# Patient Record
Sex: Female | Born: 1940 | Race: White | Hispanic: No | Marital: Married | Smoking: Former smoker
Health system: Southern US, Community
[De-identification: ages and names within clinical notes are randomized; demographics above are authoritative.]

## PROBLEM LIST (undated history)

## (undated) DIAGNOSIS — M810 Age-related osteoporosis without current pathological fracture: Secondary | ICD-10-CM

## (undated) DIAGNOSIS — K219 Gastro-esophageal reflux disease without esophagitis: Secondary | ICD-10-CM

## (undated) DIAGNOSIS — B3731 Acute candidiasis of vulva and vagina: Secondary | ICD-10-CM

## (undated) DIAGNOSIS — L299 Pruritus, unspecified: Secondary | ICD-10-CM

## (undated) DIAGNOSIS — R7309 Other abnormal glucose: Secondary | ICD-10-CM

## (undated) DIAGNOSIS — J449 Chronic obstructive pulmonary disease, unspecified: Secondary | ICD-10-CM

## (undated) DIAGNOSIS — I10 Essential (primary) hypertension: Secondary | ICD-10-CM

## (undated) DIAGNOSIS — E785 Hyperlipidemia, unspecified: Secondary | ICD-10-CM

## (undated) DIAGNOSIS — H169 Unspecified keratitis: Secondary | ICD-10-CM

## (undated) DIAGNOSIS — B373 Candidiasis of vulva and vagina: Secondary | ICD-10-CM

## (undated) DIAGNOSIS — E119 Type 2 diabetes mellitus without complications: Secondary | ICD-10-CM

## (undated) DIAGNOSIS — E559 Vitamin D deficiency, unspecified: Secondary | ICD-10-CM

## (undated) DIAGNOSIS — J441 Chronic obstructive pulmonary disease with (acute) exacerbation: Secondary | ICD-10-CM

## (undated) DIAGNOSIS — L02619 Cutaneous abscess of unspecified foot: Secondary | ICD-10-CM

## (undated) DIAGNOSIS — E876 Hypokalemia: Secondary | ICD-10-CM

## (undated) DIAGNOSIS — R0989 Other specified symptoms and signs involving the circulatory and respiratory systems: Secondary | ICD-10-CM

## (undated) DIAGNOSIS — B49 Unspecified mycosis: Secondary | ICD-10-CM

## (undated) DIAGNOSIS — R799 Abnormal finding of blood chemistry, unspecified: Secondary | ICD-10-CM

## (undated) DIAGNOSIS — T148XXA Other injury of unspecified body region, initial encounter: Secondary | ICD-10-CM

## (undated) DIAGNOSIS — L03119 Cellulitis of unspecified part of limb: Secondary | ICD-10-CM

## (undated) HISTORY — DX: Type 2 diabetes mellitus without complications: E11.9

## (undated) HISTORY — DX: Unspecified mycosis: B49

## (undated) HISTORY — PX: URETHROTOMY: SHX1083

## (undated) HISTORY — DX: Other specified symptoms and signs involving the circulatory and respiratory systems: R09.89

## (undated) HISTORY — DX: Age-related osteoporosis without current pathological fracture: M81.0

## (undated) HISTORY — DX: Other abnormal glucose: R73.09

## (undated) HISTORY — DX: Abnormal finding of blood chemistry, unspecified: R79.9

## (undated) HISTORY — DX: Acute candidiasis of vulva and vagina: B37.31

## (undated) HISTORY — DX: Essential (primary) hypertension: I10

## (undated) HISTORY — PX: APPENDECTOMY: SHX54

## (undated) HISTORY — DX: Cutaneous abscess of unspecified foot: L02.619

## (undated) HISTORY — DX: Chronic obstructive pulmonary disease, unspecified: J44.9

## (undated) HISTORY — DX: Vitamin D deficiency, unspecified: E55.9

## (undated) HISTORY — DX: Gastro-esophageal reflux disease without esophagitis: K21.9

## (undated) HISTORY — PX: EYE SURGERY: SHX253

## (undated) HISTORY — DX: Hypokalemia: E87.6

## (undated) HISTORY — DX: Chronic obstructive pulmonary disease with (acute) exacerbation: J44.1

## (undated) HISTORY — PX: SPINAL FUSION: SHX223

## (undated) HISTORY — DX: Unspecified keratitis: H16.9

## (undated) HISTORY — DX: Pruritus, unspecified: L29.9

## (undated) HISTORY — DX: Candidiasis of vulva and vagina: B37.3

## (undated) HISTORY — DX: Cellulitis of unspecified part of limb: L03.119

## (undated) HISTORY — DX: Other injury of unspecified body region, initial encounter: T14.8XXA

## (undated) HISTORY — PX: TUBAL LIGATION: SHX77

## (undated) HISTORY — DX: Hyperlipidemia, unspecified: E78.5

---

## 1968-01-09 HISTORY — PX: BACK SURGERY: SHX140

## 2006-07-03 ENCOUNTER — Other Ambulatory Visit: Payer: Self-pay

## 2007-04-16 ENCOUNTER — Ambulatory Visit: Payer: Self-pay | Admitting: Emergency Medicine

## 2007-04-16 DIAGNOSIS — J45909 Unspecified asthma, uncomplicated: Secondary | ICD-10-CM | POA: Insufficient documentation

## 2007-04-16 DIAGNOSIS — I10 Essential (primary) hypertension: Secondary | ICD-10-CM | POA: Insufficient documentation

## 2007-04-16 DIAGNOSIS — R0602 Shortness of breath: Secondary | ICD-10-CM | POA: Insufficient documentation

## 2007-04-16 DIAGNOSIS — E785 Hyperlipidemia, unspecified: Secondary | ICD-10-CM

## 2007-04-24 ENCOUNTER — Emergency Department (HOSPITAL_COMMUNITY): Admission: EM | Admit: 2007-04-24 | Discharge: 2007-04-25 | Payer: Self-pay | Admitting: Emergency Medicine

## 2007-04-25 ENCOUNTER — Ambulatory Visit: Payer: Self-pay | Admitting: Internal Medicine

## 2007-04-28 ENCOUNTER — Ambulatory Visit: Payer: Self-pay | Admitting: Internal Medicine

## 2007-05-08 ENCOUNTER — Encounter: Payer: Self-pay | Admitting: Emergency Medicine

## 2007-05-14 ENCOUNTER — Ambulatory Visit: Payer: Self-pay | Admitting: Emergency Medicine

## 2007-05-14 ENCOUNTER — Encounter: Payer: Self-pay | Admitting: Emergency Medicine

## 2007-05-14 DIAGNOSIS — J4489 Other specified chronic obstructive pulmonary disease: Secondary | ICD-10-CM | POA: Insufficient documentation

## 2007-05-14 DIAGNOSIS — K5900 Constipation, unspecified: Secondary | ICD-10-CM | POA: Insufficient documentation

## 2007-05-14 DIAGNOSIS — J449 Chronic obstructive pulmonary disease, unspecified: Secondary | ICD-10-CM

## 2007-06-24 ENCOUNTER — Ambulatory Visit: Payer: Self-pay | Admitting: Emergency Medicine

## 2007-07-01 ENCOUNTER — Telehealth: Payer: Self-pay | Admitting: Emergency Medicine

## 2007-08-04 ENCOUNTER — Ambulatory Visit: Payer: Self-pay | Admitting: Emergency Medicine

## 2007-09-08 ENCOUNTER — Ambulatory Visit: Payer: Self-pay | Admitting: Emergency Medicine

## 2009-02-16 ENCOUNTER — Inpatient Hospital Stay: Payer: Self-pay | Admitting: *Deleted

## 2010-10-03 LAB — BASIC METABOLIC PANEL
Calcium: 9.9
Chloride: 105
Creatinine, Ser: 0.71
GFR calc Af Amer: >60
Sodium: 141

## 2010-10-03 LAB — DIFFERENTIAL
Lymphs Abs: 1.7
Monocytes Relative: 7
Neutro Abs: 9.1 — ABNORMAL HIGH
Neutrophils Relative %: 75

## 2010-10-03 LAB — CBC
Hemoglobin: 15.3 — ABNORMAL HIGH
MCV: 91
RBC: 5.09
WBC: 12.2 — ABNORMAL HIGH

## 2010-10-03 LAB — POCT CARDIAC MARKERS
CKMB, poc: 1
Myoglobin, poc: 67.7

## 2011-04-16 ENCOUNTER — Inpatient Hospital Stay: Payer: Self-pay | Admitting: Internal Medicine

## 2011-04-26 ENCOUNTER — Encounter: Payer: Self-pay | Admitting: Pulmonary Disease

## 2011-04-26 ENCOUNTER — Ambulatory Visit (INDEPENDENT_AMBULATORY_CARE_PROVIDER_SITE_OTHER): Payer: Medicare Other | Admitting: Pulmonary Disease

## 2011-04-26 VITALS — BP 142/64 | HR 110 | Temp 97.5°F | Ht 65.0 in | Wt 161.4 lb

## 2011-04-26 DIAGNOSIS — J449 Chronic obstructive pulmonary disease, unspecified: Secondary | ICD-10-CM

## 2011-04-26 DIAGNOSIS — R918 Other nonspecific abnormal finding of lung field: Secondary | ICD-10-CM

## 2011-04-26 DIAGNOSIS — IMO0002 Reserved for concepts with insufficient information to code with codable children: Secondary | ICD-10-CM

## 2011-04-26 DIAGNOSIS — R9389 Abnormal findings on diagnostic imaging of other specified body structures: Secondary | ICD-10-CM

## 2011-04-26 MED ORDER — ROFLUMILAST 500 MCG PO TABS: 500.0000 ug | ORAL_TABLET | Freq: Every day | ORAL | Status: DC

## 2011-04-26 MED ORDER — BUDESONIDE 0.5 MG/2ML IN SUSP: 0.5000 mg | Freq: Two times a day (BID) | RESPIRATORY_TRACT | Status: DC

## 2011-04-26 MED ORDER — ARFORMOTEROL TARTRATE 15 MCG/2ML IN NEBU: 15.0000 ug | INHALATION_SOLUTION | Freq: Two times a day (BID) | RESPIRATORY_TRACT | Status: DC

## 2011-04-26 NOTE — Assessment & Plan Note (Signed)
Tina Castro notes that she has never been able to decrease her prednisone much lower than 15 mg daily.  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  I will start Roflumilast and hopefully we can back down to a lower dose of prednisone, though I don't know if we will ever be able to stop it altogether.    Plan: -continue prednisone 10mg  bid until after starting roflumilast -will try to start a slow pred taper on next visit. -need to clarify when she had her last DEXA scan

## 2011-04-26 NOTE — Assessment & Plan Note (Signed)
[facility] radiology noted pulmonary fibrosis on her 04/16/2011 CXR.  I have personally reviewed the images and am uncertain about this.  She will need a CT thorax to further evaluate. For now she would prefer to stay away from the hospital so we will defer that order until we see her again in 1-2 months.

## 2011-04-26 NOTE — Assessment & Plan Note (Signed)
COPD: GOLD Stage IV by spirometry, mMRC score 2 so Grade B by 2012 GOLD criteria Combined recommendations from the [employer], [employer], Designer, television/film set, European Respiratory Society (Qaseem A et al, Ann Intern Med. 2011;155(3):179) recommends tobacco cessation, pulmonary rehab (for symptomatic patients with an FEV1 < 50% predicted), supplemental oxygen (for patients with SaO2 <88% or paO2 <55), and appropriate bronchodilator therapy.  In regards to long acting bronchodilators, they recommend monotherapy (FEV1 60-80% with symptoms weak evidence, FEV1 with symptoms <60% strong evidence), or combination therapy (FEV1 <60% with symptoms, strong recommendation, moderate evidence).  One should also provide patients with annual immunizations and consider therapy for prevention of COPD exacerbations (ie. roflumilast or azithromycin) when appopriate.  -O2 therapy: Checked at rest today, she was 95%; She still needs to use oxygen while walking and sleeping -Immunizations: review on next visit -Tobacco use: quit 30 years ago, still uses smokeless tobacco -Exercise: encouraged to exercise more on this visit, will need to consider pulmonary rehab on next visit -Bronchodilator therapy: stop symbicort, start brovana and pulmicort nebulized, continue tiotropium and albuterol -Exacerbation prevention: start roflumilast

## 2011-04-26 NOTE — Patient Instructions (Signed)
We will start you on Daliresp once a day. If you get an upset stomach, it is OK to use this every other day Stop taking the symbicort Start taking brovana twice a day with your nebulizer. Take it no matter how you feel Start taking pulmicort twice a day with your nebulizer.  Take it no matter how you feel. Continue taking the prednisone as you are doing until you see me in clinic. Try to exercise as much as possible. We will see you back in 1-2 months (overbook OK).

## 2011-04-26 NOTE — Progress Notes (Signed)
Subjective:    Patient ID: Tina Castro, female    DOB: 20-Aug-1940, 71 y.o.   MRN: [id number]  HPI  Ms. Lehtinen is a 71 y/o female previously followed by Dr. Delton Coombes in the [facility] [place] office for very severe COPD who returns to our clinic for further management. In 2009 she qualified for home O2 when she dropped to 85% on RA while walking with oxygen. Since that visit she states that her breathing has done fairly well but she had a hosptilization at [facility] one week ago for five days for what sounds like a COPD exacerbation.  She stated prior to her hospitalization she noted increasing shortness breath, no cough, no fever, or chills.  She received breathing reatments, antibiotics, and was dx with bronchitis.  She is now better; "back to normal".  She states that prior to the hospitalization she had been taken off of her perforomist and pulmicort by her DME company and no substitution had been made.  Since the hospitalization she is on 2 L O2 continuously, whereas prior her prescription had been for nocturnal and exertional only.  She states that she can't walk fast but she can walk several hundred yards without stopping and can clean her house independently.  She does not have a chronic cough.  Past Medical History  Diagnosis Date  . COPD (chronic obstructive pulmonary disease)   . Hypertension   . Hyperlipidemia      Family History  Problem Relation Age of Onset  . Emphysema Daughter   . Asthma Other     grandson  . Heart disease Mother   . Heart disease Father   . Clotting disorder Mother      History   Social History  . Marital Status: Married    Spouse Name: N/A    Number of Children: Y  . Years of Education: N/A   Occupational History  . retired from [employer]    Social History Main Topics  . Smoking status: Former Smoker -- 1.0 packs/day for 20 years    Types: Cigarettes    Quit date: 01/08/1981  . Smokeless tobacco: Current User    Types: Snuff, Chew  . Alcohol Use: Not on file  .  Drug Use: Not on file  . Sexually Active: Not on file   Other Topics Concern  . Not on file   Social History Narrative  . No narrative on file     No Known Allergies     Current outpatient prescriptions:albuterol (PROAIR HFA) 108 (90 BASE) MCG/ACT inhaler, Inhale 2 puffs into the lungs every 6 (six) hours as needed., Disp: , Rfl: ;  atorvastatin (LIPITOR) 10 MG tablet, Take 10 mg by mouth daily., Disp: , Rfl: ;  fluconazole (DIFLUCAN) 100 MG tablet, Take 100 mg by mouth daily., Disp: , Rfl:  ipratropium-albuterol (DUONEB) 0.5-2.5 (3) MG/3ML SOLN, Take 3 mLs by nebulization every 4 (four) hours as needed., Disp: , Rfl: ;  olmesartan (BENICAR) 40 MG tablet, Take 40 mg by mouth daily., Disp: , Rfl: ;  potassium chloride (K-DUR) 10 MEQ tablet, Take 10 mEq by mouth daily., Disp: , Rfl: ;  predniSONE (DELTASONE) 10 MG tablet, Take 10 mg by mouth as directed., Disp: , Rfl:  tiotropium (SPIRIVA) 18 MCG inhalation capsule, Place 18 mcg into inhaler and inhale daily., Disp: , Rfl: ;  verapamil (VERELAN PM) 240 MG 24 hr capsule, Take 240 mg by mouth at bedtime., Disp: , Rfl: ;  Vitamin D, Ergocalciferol, (DRISDOL) 50000 UNITS CAPS,  Take 50,000 Units by mouth every 7 (seven) days., Disp: , Rfl: ;  arformoterol (BROVANA) 15 MCG/2ML NEBU, Take 2 mLs (15 mcg total) by nebulization 2 (two) times daily., Disp: 120 mL, Rfl: 5 budesonide (PULMICORT) 0.5 MG/2ML nebulizer solution, Take 2 mLs (0.5 mg total) by nebulization 2 (two) times daily., Disp: 120 mL, Rfl: 5;  roflumilast (DALIRESP) 500 MCG TABS tablet, Take 1 tablet (500 mcg total) by mouth daily., Disp: 30 tablet, Rfl: 2   Review of Systems  Constitutional: Negative for fever, chills, diaphoresis, activity change, appetite change, fatigue and unexpected weight change.  HENT: Negative for hearing loss, ear pain, nosebleeds, congestion, sore throat, facial swelling, rhinorrhea, sneezing, mouth sores, trouble swallowing, neck pain, neck stiffness, dental  problem, voice change, postnasal drip, sinus pressure, tinnitus and ear discharge.   Eyes: Negative for photophobia, discharge, itching and visual disturbance.  Respiratory: Positive for cough and shortness of breath. Negative for apnea, choking, chest tightness, wheezing and stridor.   Cardiovascular: Negative for chest pain, palpitations and leg swelling.  Gastrointestinal: Negative for nausea, vomiting, abdominal pain, constipation, blood in stool and abdominal distention.  Genitourinary: Negative for dysuria, urgency, frequency, hematuria, flank pain, decreased urine volume and difficulty urinating.  Musculoskeletal: Negative for myalgias, back pain, joint swelling, arthralgias and gait problem.  Skin: Negative for color change, pallor and rash.  Neurological: Negative for dizziness, tremors, seizures, syncope, speech difficulty, weakness, light-headedness, numbness and headaches.  Hematological: Negative for adenopathy. Does not bruise/bleed easily.  Psychiatric/Behavioral: Negative for confusion, sleep disturbance and agitation. The patient is not nervous/anxious.        Objective:   Physical Exam Filed Vitals:   04/26/11 1403 04/26/11 1454  BP: 142/64   Pulse: 110   Temp: 97.5 F (36.4 C)   TempSrc: Oral   Height: 5\' 5"  (1.651 m)   Weight: 161 lb 6.4 oz (73.211 kg)   SpO2: 93% 95%   Gen: cushingoid in appearance, no acute distress HEENT: NCAT, PERRL, EOMi, OP clear, neck supple without masses PULM: Poor air movement but no wheezing, some crackles in bases CV: RRR, no mgr, no JVD AB: BS+, soft, nontender, no hsm Ext: warm, pitting edema in ankles bilaterally R > L, no clubbing, no cyanosis Derm: very thin skin, bruising noted bilaterally in arms and legs Neuro: A&Ox4, CN II-XII intact, strength 5/5 in all 4 extremities   2009 Full PFT: Ratio 34%, FEV1 0.71L (32% pred) TLC 3.71 L (71% pred) RV normal ERV 34% pred DLCO 40% pred  04/16/2011 CXR [facility] (my read): questionable  reticular changes vs. multiple cysts in bases, lots of emphysema    Assessment & Plan:   CHRONIC OBSTRUCTIVE PULMONARY DISEASE, SEVERE COPD: GOLD Stage IV by spirometry, mMRC score 2 so Grade B by 2012 GOLD criteria Combined recommendations from the [employer], [employer] of Chest Physicians, Designer, television/film set, European Respiratory Society (Qaseem A et al, Ann Intern Med. 2011;155(3):179) recommends tobacco cessation, pulmonary rehab (for symptomatic patients with an FEV1 < 50% predicted), supplemental oxygen (for patients with SaO2 <88% or paO2 <55), and appropriate bronchodilator therapy.  In regards to long acting bronchodilators, they recommend monotherapy (FEV1 60-80% with symptoms weak evidence, FEV1 with symptoms <60% strong evidence), or combination therapy (FEV1 <60% with symptoms, strong recommendation, moderate evidence).  One should also provide patients with annual immunizations and consider therapy for prevention of COPD exacerbations (ie. roflumilast or azithromycin) when appopriate.  -O2 therapy: Checked at rest today, she was 95%; She still  needs to use oxygen while walking and sleeping -Immunizations: review on next visit -Tobacco use: quit 30 years ago, still uses smokeless tobacco -Exercise: encouraged to exercise more on this visit, will need to consider pulmonary rehab on next visit -Bronchodilator therapy: stop symbicort, start brovana and pulmicort nebulized, continue tiotropium and albuterol -Exacerbation prevention: start roflumilast   Steroid dependence Ms. Picazo notes that she has never been able to decrease her prednisone much lower than 15 mg daily.  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  I will start Roflumilast and hopefully we can back down to a lower dose of prednisone, though I don't know if we will ever be able to stop it altogether.    Plan: -continue  prednisone 10mg  bid until after starting roflumilast -will try to start a slow pred taper on next visit. -need to clarify when she had her last DEXA scan  Abnormal CXR (chest x-ray) [facility] radiology noted pulmonary fibrosis on her 04/16/2011 CXR.  I have personally reviewed the images and am uncertain about this.  She will need a CT thorax to further evaluate. For now she would prefer to stay away from the hospital so we will defer that order until we see her again in 1-2 months.    Updated Medication List Outpatient Encounter Prescriptions as of 04/26/2011  Medication Sig Dispense Refill  . albuterol (PROAIR HFA) 108 (90 BASE) MCG/ACT inhaler Inhale 2 puffs into the lungs every 6 (six) hours as needed.      Marland Kitchen atorvastatin (LIPITOR) 10 MG tablet Take 10 mg by mouth daily.      . fluconazole (DIFLUCAN) 100 MG tablet Take 100 mg by mouth daily.      Marland Kitchen ipratropium-albuterol (DUONEB) 0.5-2.5 (3) MG/3ML SOLN Take 3 mLs by nebulization every 4 (four) hours as needed.      Marland Kitchen olmesartan (BENICAR) 40 MG tablet Take 40 mg by mouth daily.      . potassium chloride (K-DUR) 10 MEQ tablet Take 10 mEq by mouth daily.      . predniSONE (DELTASONE) 10 MG tablet Take 10 mg by mouth as directed.      . tiotropium (SPIRIVA) 18 MCG inhalation capsule Place 18 mcg into inhaler and inhale daily.      . verapamil (VERELAN PM) 240 MG 24 hr capsule Take 240 mg by mouth at bedtime.      . Vitamin D, Ergocalciferol, (DRISDOL) 50000 UNITS CAPS Take 50,000 Units by mouth every 7 (seven) days.      Marland Kitchen DISCONTD: budesonide-formoterol (SYMBICORT) 160-4.5 MCG/ACT inhaler Inhale 2 puffs into the lungs 2 (two) times daily.      Marland Kitchen arformoterol (BROVANA) 15 MCG/2ML NEBU Take 2 mLs (15 mcg total) by nebulization 2 (two) times daily.  120 mL  5  . budesonide (PULMICORT) 0.5 MG/2ML nebulizer solution Take 2 mLs (0.5 mg total) by nebulization 2 (two) times daily.  120 mL  5  . roflumilast (DALIRESP) 500 MCG TABS tablet Take 1 tablet (500  mcg total) by mouth daily.  30 tablet  2

## 2011-04-27 ENCOUNTER — Telehealth: Payer: Self-pay | Admitting: Pulmonary Disease

## 2011-04-27 DIAGNOSIS — J449 Chronic obstructive pulmonary disease, unspecified: Secondary | ICD-10-CM

## 2011-04-27 NOTE — Telephone Encounter (Signed)
mandy @ [facility] called She received order to provide pt with neubilzer  meds unfortunely pt has [employer](aar[employer] plan)  Their pharmcy does not accept this.   She is current there o2 patient.  They have let her know that they don't accept her insurance mandy is going to call pt to see where pt wants her meds set

## 2011-04-27 NOTE — Telephone Encounter (Signed)
noted 

## 2011-04-27 NOTE — Telephone Encounter (Signed)
PA started; pt tried Symbicort already; PA is gone to Pharmacy Review; decision will be made by Monday 04-30-2011; will hold in Triage for decision. Will forward to Leslie's inbox to keep an eye out for approval/denial.

## 2011-04-30 ENCOUNTER — Telehealth: Payer: Self-pay | Admitting: Pulmonary Disease

## 2011-04-30 MED ORDER — ARFORMOTEROL TARTRATE 15 MCG/2ML IN NEBU: 15.0000 ug | INHALATION_SOLUTION | Freq: Two times a day (BID) | RESPIRATORY_TRACT | Status: DC

## 2011-04-30 MED ORDER — BUDESONIDE 0.5 MG/2ML IN SUSP: 0.5000 mg | Freq: Two times a day (BID) | RESPIRATORY_TRACT | Status: DC

## 2011-04-30 NOTE — Telephone Encounter (Signed)
I spoke with the pt and she would like to see if she can get the nebulizer medications, Brovana and Pulmicort, through [facility]. These medications will need to filed with MCR Part B. The pt will contact the office if there are any additional problems with these prescriptions.

## 2011-04-30 NOTE — Telephone Encounter (Signed)
Can we find out where she can get the nebulized meds (brovana/pulmicort) with her insurance and help her get them?  Thanks, Kipp Brood

## 2011-05-01 ENCOUNTER — Telehealth: Payer: Self-pay | Admitting: Family Medicine

## 2011-05-01 NOTE — Telephone Encounter (Signed)
Somehow the DME referral went into the [facility] work queue. Dr. Henrene Pastor does not want to change brovana. After further review of the chart the rx for neb meds was sent to [facility] for the pt. So I called the pt to see if she was able to get the meds from [facility]. She states she has not because she was waiting for [facility] to call her. i advised that the rx was sent, but she will need to go to [facility] and give then her insurance information before they can fill the meds. Pt states she will do so and call us back if any issues. Carron Curie, CMA

## 2011-05-01 NOTE — Telephone Encounter (Signed)
See phone note from 05-01-11. Carron Curie, CMA

## 2011-05-01 NOTE — Telephone Encounter (Signed)
Patient states budesonide was called to pharmacy, but still did not receive brovana.  [facility] [address] [employer]

## 2011-05-01 NOTE — Telephone Encounter (Signed)
[facility] takes the patients insurance.  They only carry pulmicort in the 0.25mg  /18ml strength.  They do not carry the brovana.  They have changed to proformist.  If Dr Kendrick Fries wants to change the rx to this strength of pulmicort and to proformist please fax the order to [facility] [phone] atten [place] [facility] and she also need to know how much of these meds the patient has left so they will know when to ship to her.

## 2011-05-01 NOTE — Telephone Encounter (Signed)
I have placed an order for [facility] to see where the pt can get her neb meds. Carron Curie, CMA

## 2011-05-01 NOTE — Telephone Encounter (Signed)
Addended by: Darrell Jewel on: 05/01/2011 09:02 AM   Modules accepted: Orders

## 2011-05-02 MED ORDER — BUDESONIDE 0.5 MG/2ML IN SUSP: 0.5000 mg | Freq: Two times a day (BID) | RESPIRATORY_TRACT | Status: DC

## 2011-05-02 MED ORDER — FORMOTEROL FUMARATE 20 MCG/2ML IN NEBU: 20.0000 ug | INHALATION_SOLUTION | Freq: Two times a day (BID) | RESPIRATORY_TRACT | Status: DC

## 2011-05-02 NOTE — Telephone Encounter (Signed)
The whole issue was that [facility] did not take the pt insurance. I received a staff message from Dr. Henrene Pastor and he states he is ok with changing the neb to perforomist. So I advised the pt of this and have sent order to get nebs from [facility] (see message from 05-01-11). Carron Curie, CMA rx printed and given to [facility]. Carron Curie, CMA

## 2011-05-02 NOTE — Telephone Encounter (Signed)
I spoke with pt and she states she is going to call [facility] and see how much this will cost through them first and will call us back

## 2011-05-02 NOTE — Telephone Encounter (Signed)
Pt just called the pharmacy. Says rx will cost her 144.00. Can't afford this. Tina Castro

## 2011-05-02 NOTE — Telephone Encounter (Signed)
ATC [facility] to see if they ever received rx but was on hold x 10 minutes and no one came to the phone. wcb

## 2011-05-02 NOTE — Telephone Encounter (Signed)
I called [facility] and they did not receive rx for brovana. I called and gave verbal order for this. Pt is aware. Will forward message back to leslie to hold for the denial/approval of the daliresp

## 2011-05-03 ENCOUNTER — Telehealth: Payer: Self-pay | Admitting: Pulmonary Disease

## 2011-05-03 MED ORDER — BUDESONIDE 0.25 MG/2ML IN SUSP: RESPIRATORY_TRACT | Status: DC

## 2011-05-03 NOTE — Telephone Encounter (Signed)
New rx was printed and faxed to [facility].

## 2011-05-14 ENCOUNTER — Telehealth: Payer: Self-pay | Admitting: Pulmonary Disease

## 2011-05-14 MED ORDER — PREDNISONE 10 MG PO TABS: 10.0000 mg | ORAL_TABLET | ORAL | Status: DC

## 2011-05-14 NOTE — Telephone Encounter (Signed)
I spoke with pt and she is requesting a refill on her prednisone. Since BQ wanted pt to continue how she is taking this until she cam in for next OV in June I have called in refill into the pharmacy. Nothing further was needed

## 2011-05-29 ENCOUNTER — Telehealth: Payer: Self-pay | Admitting: Pulmonary Disease

## 2011-05-29 NOTE — Telephone Encounter (Signed)
Plan:  -continue prednisone 10mg  bid until after starting roflumilast  -will try to start a slow pred taper on next visit.  -need to clarify when she had her last DEXA scan  I spoke with pt and she states she is still taking the prednisone BID. She tried taking once a day and she began to have increase SOB. Pt is requesting refill on her prednisone 10 mg. This was last refilled 05/14/11 #30 x 0 refills. Pt has f/u scheduled for 06/11/11. Please advise if okay to refill Dr. Kendrick Fries, thanks

## 2011-05-29 NOTE — Telephone Encounter (Signed)
Refill OK by me.

## 2011-05-30 MED ORDER — PREDNISONE 10 MG PO TABS: 10.0000 mg | ORAL_TABLET | ORAL | Status: DC

## 2011-05-30 NOTE — Telephone Encounter (Signed)
I spoke with pt and is aware rx has been sent to the pharmacy 

## 2011-06-07 ENCOUNTER — Telehealth: Payer: Self-pay | Admitting: Pulmonary Disease

## 2011-06-07 MED ORDER — TIOTROPIUM BROMIDE MONOHYDRATE 18 MCG IN CAPS: 18.0000 ug | ORAL_CAPSULE | Freq: Every day | RESPIRATORY_TRACT | Status: DC

## 2011-06-07 NOTE — Telephone Encounter (Signed)
Called and spoke with pt and she is aware of her spiriva that has been sent to the pharmacy.  Nothing further was needed.

## 2011-06-11 ENCOUNTER — Encounter: Payer: Self-pay | Admitting: Pulmonary Disease

## 2011-06-11 ENCOUNTER — Ambulatory Visit: Payer: Medicare Other | Admitting: Pulmonary Disease

## 2011-06-11 ENCOUNTER — Ambulatory Visit (INDEPENDENT_AMBULATORY_CARE_PROVIDER_SITE_OTHER): Payer: Medicare Other | Admitting: Pulmonary Disease

## 2011-06-11 VITALS — BP 158/70 | HR 97 | Temp 98.0°F | Ht 65.0 in | Wt 150.8 lb

## 2011-06-11 DIAGNOSIS — IMO0002 Reserved for concepts with insufficient information to code with codable children: Secondary | ICD-10-CM

## 2011-06-11 DIAGNOSIS — R9389 Abnormal findings on diagnostic imaging of other specified body structures: Secondary | ICD-10-CM

## 2011-06-11 DIAGNOSIS — R918 Other nonspecific abnormal finding of lung field: Secondary | ICD-10-CM

## 2011-06-11 DIAGNOSIS — J449 Chronic obstructive pulmonary disease, unspecified: Secondary | ICD-10-CM

## 2011-06-11 MED ORDER — AZITHROMYCIN 250 MG PO TABS: 250.0000 mg | ORAL_TABLET | Freq: Every day | ORAL | Status: DC

## 2011-06-11 MED ORDER — PREDNISONE 5 MG PO TABS: 17.5000 mg | ORAL_TABLET | Freq: Every day | ORAL | Status: DC

## 2011-06-11 NOTE — Progress Notes (Signed)
Synopsis: Tina Castro first saw [name] in [place] in early 2013 and was diagnosed with severe COPD.  She uses 2 L O2 continuosly.  Her 2009 spirometry showed severe obstruction (32% pred) and a DLCO of 40% pred.  She has been on chronic steroids for years.  Subjective:    Patient ID: Tina Castro, female    DOB: October 14, 1940, 71 y.o.   MRN: [id number]  HPI  06/11/2011 ROV -- Tina Castro states that her breathing has been better but she has been having hip pain, which is limiting her walking.  This is slowly improving with aleve.  Her cough has improved, and she is only using O2 with exertion and at sleep.  No leg swelling, no chest pain.  She has been using perforomist, but not pulmicort.  She has been using tiotropium.    Review of Systems  HENT: Negative for congestion, rhinorrhea, sneezing and postnasal drip.   Respiratory: Positive for shortness of breath. Negative for cough, chest tightness and wheezing.   Cardiovascular: Negative for chest pain and leg swelling.       Objective:   Physical Exam  Filed Vitals:   06/11/11 1409  BP: 158/70  Pulse: 97  Temp: 98 F (36.7 C)  TempSrc: Oral  Height: 5\' 5"  (1.651 m)  Weight: 68.402 kg (150 lb 12.8 oz)  SpO2: 95%    Gen: chronically ill appearing, cushingoid appearance HEENT: NCAT, PERRL, EOMi, OP clear, neck supple without masses; however buffalo hump noted PULM: Insp crackles in bases, cta in upper lobes CV: RRR, slight systolic murmur, no JVD AB: BS+, soft, nontender, no hsm Ext: warm, trace edema, no clubbing, no cyanosis Derm: thin skin, multiple bruises  2009 PFT's with severe obstruction (32% predicted) and DLCO of 40% predicted     Assessment & Plan:   CHRONIC OBSTRUCTIVE PULMONARY DISEASE, SEVERE Tina Castro has severe COPD and is steroid dependent, but fortunately the last few months has been a stable interval.  Today medication reconciliation is our biggest challenge as she is not taking the pulmicort nebulizer for some  reason.  She does not want to start pulmonary rehab despite my recommendation, as she says that she is too busy.  We really need to work to get her off of the prednisone. She could not afford the roflumilast despite her multiple recurrent exacerbations.  Plan:  -start azithromycin 250mg  po daily for exacerbation prevention, she was counseled to stop if palpitations or hearing change -she will need an EKG and CMET next week after starting this -decrease prednisone to 17.5 mg daily, hold there for 1-2 months then decrease gradually -continue spiriva -continue perforomist, we will re-prescribe pulmicort -rtc 1-2 months  Steroid dependence We really need to decrease her prednisone, gradually.  She has not done well with dosing less than 15 mg daily in the past (recurrent exacerbations).  I would have liked to try Roflumilast daily, but she could not afford it.  We'll try azithromycin daily to prevent exacerbations while decreasing the dose.  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  She prefers to hold off on further work up at this time as she would prefer to forgo another CT.  This is reasonable considering her clinical stability.  I advised her that we would work up the fibrosis if her dyspnea progresses.    Updated Medication List Outpatient  Encounter Prescriptions as of 06/11/2011  Medication Sig Dispense Refill  . albuterol (PROAIR HFA) 108 (90 BASE) MCG/ACT inhaler Inhale 2 puffs into the lungs every 6 (six) hours as needed.      Marland Kitchen atorvastatin (LIPITOR) 10 MG tablet Take 10 mg by mouth daily.      . formoterol (PERFOROMIST) 20 MCG/2ML nebulizer solution Take 2 mLs (20 mcg total) by nebulization 2 (two) times daily.  120 mL  6  . ipratropium-albuterol (DUONEB) 0.5-2.5 (3) MG/3ML SOLN Take 3 mLs by nebulization 2  (two) times daily.       . naproxen sodium (ANAPROX) 220 MG tablet Take 220 mg by mouth 2 (two) times daily as needed.      Marland Kitchen olmesartan (BENICAR) 40 MG tablet Take 40 mg by mouth daily.      . potassium chloride (K-DUR) 10 MEQ tablet Take 10 mEq by mouth daily.      . predniSONE (DELTASONE) 5 MG tablet Take 3.5 tablets (17.5 mg total) by mouth daily.  30 tablet  4  . tiotropium (SPIRIVA) 18 MCG inhalation capsule Place 1 capsule (18 mcg total) into inhaler and inhale daily.  30 capsule  6  . verapamil (VERELAN PM) 240 MG 24 hr capsule Take 240 mg by mouth at bedtime.      . Vitamin D, Ergocalciferol, (DRISDOL) 50000 UNITS CAPS Take 50,000 Units by mouth every 7 (seven) days.      Marland Kitchen DISCONTD: predniSONE (DELTASONE) 10 MG tablet Take 1 tablet (10 mg total) by mouth as directed.  30 tablet  0  . DISCONTD: predniSONE (DELTASONE) 10 MG tablet Take 20 mg by mouth daily.      Marland Kitchen azithromycin (ZITHROMAX Z-PAK) 250 MG tablet Take 1 tablet (250 mg total) by mouth daily.  30 each  2  . DISCONTD: arformoterol (BROVANA) 15 MCG/2ML NEBU Take 2 mLs (15 mcg total) by nebulization 2 (two) times daily. Dx:  496, FILE WITH MCR PART B  120 mL  5  . DISCONTD: budesonide (PULMICORT) 0.25 MG/2ML nebulizer solution 1 vial in nebulizer twice daily Dx 496  120 mL  5  . DISCONTD: budesonide (PULMICORT) 0.5 MG/2ML nebulizer solution Take 2 mLs (0.5 mg total) by nebulization 2 (two) times daily.  120 mL  6  . DISCONTD: fluconazole (DIFLUCAN) 100 MG tablet Take 100 mg by mouth daily.      Marland Kitchen DISCONTD: roflumilast (DALIRESP) 500 MCG TABS tablet Take 1 tablet (500 mcg total) by mouth daily.  30 tablet  2

## 2011-06-11 NOTE — Assessment & Plan Note (Addendum)
We really need to decrease her prednisone, gradually.  She has not done well with dosing less than 15 mg daily in the past (recurrent exacerbations).  I would have liked to try Roflumilast daily, but she could not afford it.  We'll try azithromycin daily to prevent exacerbations while decreasing the dose.  Plan: -start azithromycin 250mg  po daily -decrease prednisone to 17.5 mg daily for now, will slowly decrease after starting azithromycin

## 2011-06-11 NOTE — Assessment & Plan Note (Addendum)
Tina Castro has severe COPD and is steroid dependent, but fortunately the last few months has been a stable interval.  Today medication reconciliation is our biggest challenge as she is not taking the pulmicort nebulizer for some reason.  She does not want to start pulmonary rehab despite my recommendation, as she says that she is too busy.  We really need to work to get her off of the prednisone. She could not afford the roflumilast despite her multiple recurrent exacerbations.  Plan:  -start azithromycin 250mg  po daily for exacerbation prevention, she was counseled to stop if palpitations or hearing change -she will need an EKG and CMET next week after starting this -decrease prednisone to 17.5 mg daily, hold there for 1-2 months then decrease gradually -continue spiriva -continue perforomist, we will re-prescribe pulmicort -rtc 1-2 months

## 2011-06-11 NOTE — Patient Instructions (Signed)
Start taking azithromycin 250mg  daily, watch for heart palpitations and hearing trouble. We will have you come in to the [place] office next week for an EKG and blood work after starting the azithromycin We will have you start taking pulmicort in addition to the perforomist through home health. Decrease your prednisone to 17.5mg  daily (10 mg in the morning, 7.5mg  at night) We will see you back in 2 months.

## 2011-06-12 NOTE — Assessment & Plan Note (Signed)
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  She prefers to hold off on further work up at this time as she would prefer to forgo another CT.  This is reasonable considering her clinical stability.  I advised her that we would work up the fibrosis if her dyspnea progresses.

## 2011-06-18 ENCOUNTER — Other Ambulatory Visit (INDEPENDENT_AMBULATORY_CARE_PROVIDER_SITE_OTHER): Payer: Medicare Other

## 2011-06-18 ENCOUNTER — Other Ambulatory Visit: Payer: Self-pay | Admitting: Pulmonary Disease

## 2011-06-18 DIAGNOSIS — J449 Chronic obstructive pulmonary disease, unspecified: Secondary | ICD-10-CM

## 2011-06-18 LAB — COMPREHENSIVE METABOLIC PANEL
ALT: 19 U/L (ref 0–35)
AST: 16 U/L (ref 0–37)
Albumin: 3.6 g/dL (ref 3.5–5.2)
Alkaline Phosphatase: 89 U/L (ref 39–117)
Potassium: 4.1 mEq/L (ref 3.5–5.1)
Sodium: 142 mEq/L (ref 135–145)
Total Protein: 6.2 g/dL (ref 6.0–8.3)

## 2011-07-25 ENCOUNTER — Other Ambulatory Visit: Payer: Self-pay | Admitting: *Deleted

## 2011-07-25 ENCOUNTER — Telehealth: Payer: Self-pay | Admitting: Pulmonary Disease

## 2011-07-25 MED ORDER — PREDNISONE 5 MG PO TABS: 17.5000 mg | ORAL_TABLET | Freq: Every day | ORAL | Status: DC

## 2011-07-25 NOTE — Telephone Encounter (Signed)
[facility] REQUESTING RX  PREDNISONE 5 MG <> TAKE 3 AND 1/2 (17.5 MG ) TABLETS BY MOUTH EVERY DAY No Known Allergies DR MCQUAID IS THIS OK TO FILL THNAK YOU

## 2011-07-26 MED ORDER — PREDNISONE 5 MG PO TABS: 17.5000 mg | ORAL_TABLET | Freq: Every day | ORAL | Status: DC

## 2011-07-26 NOTE — Telephone Encounter (Signed)
Yes, ok to refill 

## 2011-07-26 NOTE — Telephone Encounter (Signed)
rx sent to pharmacy

## 2011-08-02 ENCOUNTER — Ambulatory Visit: Payer: Self-pay

## 2011-08-14 ENCOUNTER — Ambulatory Visit: Payer: Medicare Other | Admitting: Pulmonary Disease

## 2011-08-21 ENCOUNTER — Telehealth: Payer: Self-pay | Admitting: Pulmonary Disease

## 2011-08-22 NOTE — Telephone Encounter (Signed)
Budesonide not on pt's current med list, but per 6.3.13 ov with BQ pt is to take this medication.  Suspect epic removed this from pt's list b/c of an "end date." Will re-add the budesonide.  Rx's printed for BQ to sign - blank rx's placed in PCC's inbox per conversation w/ Bjorn Loser.

## 2011-08-22 NOTE — Telephone Encounter (Signed)
Spoke with patient. She will be out of performist tomorrow. She has enough of budesonide to last until [facility] can supply pt with neb meds. Advised pt to ask for Financial Form with [facility] to help with cost of medication. Pt has been provided samples to allow time for [facility] to arrange shipment of meds.  Please provide Rx for nebulizer medications in order to fax to [facility]. Pt is aware. Rhonda J Cobb

## 2011-08-22 NOTE — Telephone Encounter (Signed)
I spoke with pt and she stated through her [facility] her nebulizer medications performist and budesonide--she has to pay her co-pay of $159. She can't afford this. I called [facility] to confirm this and was advised by Victorino Dike since pt only has the 1 insurance [employer] that is her co-pay she has to pay. They use to be able to ship pt their medications and then send them a bill for the co-pay but now they are not allowed to do so. [facility] does not accept pt insurance and I called [facility] to see but was advised they would not know until they run pt insurance through. Will send to [facility] to see if they can see if they know of DME where pt can be shipped her medication and then send her bill to make payment arrangements. Please advise PCC's thanks

## 2011-08-23 MED ORDER — FORMOTEROL FUMARATE 20 MCG/2ML IN NEBU: 20.0000 ug | INHALATION_SOLUTION | Freq: Two times a day (BID) | RESPIRATORY_TRACT | Status: DC

## 2011-08-23 MED ORDER — BUDESONIDE 0.25 MG/2ML IN SUSP: 0.2500 mg | Freq: Two times a day (BID) | RESPIRATORY_TRACT | Status: DC

## 2011-08-23 NOTE — Telephone Encounter (Signed)
I have printed off rx's and was giving to [place] per her request.

## 2011-09-13 ENCOUNTER — Ambulatory Visit (INDEPENDENT_AMBULATORY_CARE_PROVIDER_SITE_OTHER): Payer: Medicare Other | Admitting: Pulmonary Disease

## 2011-09-13 ENCOUNTER — Encounter: Payer: Self-pay | Admitting: Pulmonary Disease

## 2011-09-13 VITALS — BP 126/62 | HR 74 | Temp 97.7°F | Ht 65.0 in | Wt 156.4 lb

## 2011-09-13 DIAGNOSIS — J449 Chronic obstructive pulmonary disease, unspecified: Secondary | ICD-10-CM

## 2011-09-13 DIAGNOSIS — Z5181 Encounter for therapeutic drug level monitoring: Secondary | ICD-10-CM

## 2011-09-13 DIAGNOSIS — Z23 Encounter for immunization: Secondary | ICD-10-CM

## 2011-09-13 MED ORDER — LEVOFLOXACIN 500 MG PO TABS: 500.0000 mg | ORAL_TABLET | Freq: Every day | ORAL | Status: AC

## 2011-09-13 MED ORDER — ALBUTEROL SULFATE (2.5 MG/3ML) 0.083% IN NEBU: 2.5000 mg | INHALATION_SOLUTION | Freq: Four times a day (QID) | RESPIRATORY_TRACT | Status: AC | PRN

## 2011-09-13 MED ORDER — AZITHROMYCIN 250 MG PO TABS: 250.0000 mg | ORAL_TABLET | Freq: Every day | ORAL | Status: DC

## 2011-09-13 NOTE — Patient Instructions (Addendum)
Use Levaquin 500mg  by mouth daily for one week. Stop the azithromycin while you are on the Levaquin Start the azithromycin two days after completing the Levaquin Keep using the perforomist twice a day Keep using the spiriva There is no reason for you to take the pulmicort nebulizer, stop taking it. Use albuterol nebulized as needed for shortness of breath. In two weeks, decrease your prednisone to 12.5mg  daily We will see you back in 3 months.

## 2011-09-13 NOTE — Progress Notes (Signed)
Synopsis: Tina Castro first saw [name] in [place] in early 2013 and was diagnosed with severe COPD.  She uses 2 L O2 continuosly.  Her 2009 spirometry showed severe obstruction (32% pred) and a DLCO of 40% pred.  She has been on chronic steroids for years.  Subjective:    Patient ID: Tina Castro, female    DOB: [birth year], 71 y.o.   MRN: [id number]  HPI   06/11/2011 ROV -- Tina Castro states that her breathing has been better but she has been having hip pain, which is limiting her walking.  This is slowly improving with aleve.  Her cough has improved, and she is only using O2 with exertion and at sleep.  No leg swelling, no chest pain.  She has been using perforomist, but not pulmicort.  She has been using tiotropium.    09/13/11 ROV-- Tina Castro states that she has been doing fairly well.  Some days are better than others.  She thinks that the albuterol isn't quite strong enough because it doesn't last very long.  She is continuing to use her spiriva and perforomist, but it's not clear to me if she is taking the pulmicort. She states that she decreased her prednisone to 15 mg after the last visit. For the last 3 weeks she's had a cold with a cough productive of green sputum. She has not had sinus symptoms, fever, or increasing wheezing or shortness of breath. She continues to take the azithromycin daily and has not had hearing trouble while taking this.  Review of Systems  HENT: Negative for congestion, rhinorrhea, sneezing and postnasal drip.   Respiratory: Positive for shortness of breath. Negative for cough, chest tightness and wheezing.   Cardiovascular: Negative for chest pain and leg swelling.       Objective:   Physical Exam   Filed Vitals:   09/13/11 1411  BP: 126/62  Pulse: 74  Temp: 97.7 F (36.5 C)  TempSrc: Oral  Height: 5\' 5"  (1.651 m)  Weight: 156 lb 6.4 oz (70.943 kg)  SpO2: 94%    Gen: chronically ill appearing, cushingoid appearance HEENT: NCAT, PERRL, EOMi, OP clear,  neck supple without masses; however buffalo hump noted PULM: Insp crackles in bases, cta in upper lobes CV: RRR, slight systolic murmur, no JVD AB: BS+, soft, nontender, no hsm Ext: warm, trace edema, no clubbing, no cyanosis Derm: thin skin, multiple bruises  2009 PFT's with severe obstruction (32% predicted) and DLCO of 40% predicted  09/16/2013 EKG QTc interval 422     Assessment & Plan:   CHRONIC OBSTRUCTIVE PULMONARY DISEASE, SEVERE Tina Castro's COPD appears to be a little worse today likely due to the fact that she has had an upper respiratory infection in the last several weeks. I think that it is likely that the azithromycin has attenuated her response to this illness but given the fact that she still has ongoing green sputum production she needs a better antibiotic temporarily.  In terms of her long-term management think the azithromycin has helped, however aren't like to see her on a lower dose of prednisone given her multiple comorbidities including osteoporosis. She really needs to start exercising on a regular basis but she has a near total lack of motivation for this.  Plan: -Continue azithromycin and EKG will be checked today to ensure QTc interval is normal -Continue Spiriva -Continue perforomist -Stop Pulmicort as there is really no need for this on chronic steroids. -Take Levaquin for one week, she was instructed to stop  the azithromycin when she's taking this -A flu shot was given today -Resume azithromycin 2 days after stopping the Levaquin -2 weeks from now she is to decrease her prednisone to 12.5 mg daily and continue on this until she sees me back in 3 months -Exercise was stressed at length and I encouraged her greatly to consider pulmonary rehabilitation but as noted above she's not motivated for this.    Updated Medication List Outpatient Encounter Prescriptions as of 09/13/2011  Medication Sig Dispense Refill  . albuterol (PROAIR HFA) 108 (90 BASE) MCG/ACT  inhaler Inhale 2 puffs into the lungs every 6 (six) hours as needed.      Marland Kitchen alendronate (FOSAMAX) 70 MG tablet Take 70 mg by mouth every 7 (seven) days. Take with a full glass of water on an empty stomach.      Marland Kitchen atorvastatin (LIPITOR) 10 MG tablet Take 10 mg by mouth daily.      Marland Kitchen azithromycin (ZITHROMAX Z-PAK) 250 MG tablet Take 1 tablet (250 mg total) by mouth daily.  30 each  2  . budesonide (PULMICORT) 0.25 MG/2ML nebulizer solution Take 2 mLs (0.25 mg total) by nebulization 2 (two) times daily.  120 mL  6  . formoterol (PERFOROMIST) 20 MCG/2ML nebulizer solution Take 2 mLs (20 mcg total) by nebulization 2 (two) times daily.  120 mL  6  . naproxen sodium (ANAPROX) 220 MG tablet Take 220 mg by mouth 2 (two) times daily as needed.      Marland Kitchen olmesartan (BENICAR) 40 MG tablet Take 40 mg by mouth daily.      . potassium chloride (K-DUR) 10 MEQ tablet Take 10 mEq by mouth daily.      . predniSONE (DELTASONE) 5 MG tablet Take 15 mg by mouth daily.      Marland Kitchen tiotropium (SPIRIVA) 18 MCG inhalation capsule Place 1 capsule (18 mcg total) into inhaler and inhale daily.  30 capsule  6  . verapamil (VERELAN PM) 240 MG 24 hr capsule Take 240 mg by mouth at bedtime.      . Vitamin D, Ergocalciferol, (DRISDOL) 50000 UNITS CAPS Take 50,000 Units by mouth every 7 (seven) days.      Marland Kitchen DISCONTD: predniSONE (DELTASONE) 5 MG tablet Take 3.5 tablets (17.5 mg total) by mouth daily.  105 tablet  1  . DISCONTD: ipratropium-albuterol (DUONEB) 0.5-2.5 (3) MG/3ML SOLN Take 3 mLs by nebulization 2 (two) times daily.

## 2011-09-13 NOTE — Assessment & Plan Note (Addendum)
[name]'s COPD appears to be a little worse today likely due to the fact that she has had an upper respiratory infection in the last several weeks. I think that it is likely that the azithromycin has attenuated her response to this illness but given the fact that she still has ongoing green sputum production she needs a better antibiotic temporarily.  In terms of her long-term management think the azithromycin has helped, however aren't like to see her on a lower dose of prednisone given her multiple comorbidities including osteoporosis. She really needs to start exercising on a regular basis but she has a near total lack of motivation for this.  Plan: -Continue azithromycin and EKG will be checked today to ensure QTc interval is normal -Continue Spiriva -Continue perforomist -Stop Pulmicort as there is really no need for this on chronic steroids. -Take Levaquin for one week, she was instructed to stop the azithromycin when she's taking this -A flu shot was given today -Resume azithromycin 2 days after stopping the Levaquin -2 weeks from now she is to decrease her prednisone to 12.5 mg daily and continue on this until she sees me back in 3 months -Exercise was stressed at length and I encouraged her greatly to consider pulmonary rehabilitation but as noted above she's not motivated for this.

## 2011-10-17 ENCOUNTER — Telehealth: Payer: Self-pay | Admitting: Pulmonary Disease

## 2011-10-17 NOTE — Telephone Encounter (Signed)
Spoke with pharmacy-aware that we did not put pt on Lasix nor is it on her med list here; pharmacy made a mistake and meant to call another MD on pts behalf. Nothing needed from our office.

## 2011-10-18 ENCOUNTER — Ambulatory Visit (INDEPENDENT_AMBULATORY_CARE_PROVIDER_SITE_OTHER)
Admission: RE | Admit: 2011-10-18 | Discharge: 2011-10-18 | Disposition: A | Discharge status: Home / Self Care | Payer: Medicare Other | Source: Ambulatory Visit | Attending: Adult Health | Admitting: Adult Health

## 2011-10-18 ENCOUNTER — Ambulatory Visit (INDEPENDENT_AMBULATORY_CARE_PROVIDER_SITE_OTHER): Payer: Medicare Other | Admitting: Adult Health

## 2011-10-18 ENCOUNTER — Encounter: Payer: Self-pay | Admitting: Adult Health

## 2011-10-18 VITALS — BP 120/60 | HR 103 | Temp 97.0°F | Ht 66.0 in | Wt 154.2 lb

## 2011-10-18 DIAGNOSIS — R0602 Shortness of breath: Secondary | ICD-10-CM

## 2011-10-18 DIAGNOSIS — J449 Chronic obstructive pulmonary disease, unspecified: Secondary | ICD-10-CM

## 2011-10-18 MED ORDER — PREDNISONE 10 MG PO TABS: ORAL_TABLET | ORAL | Status: DC

## 2011-10-18 NOTE — Patient Instructions (Addendum)
Increase Prednisone 40mg  daily for 5 days then 30mg  daily for 5 days then hold at 20mg  daily  Continue on Zithromax daily  I will call with xray results.  Please contact office for sooner follow up if symptoms do not improve or worsen or seek emergency care  follow up  Dr. Kendrick Fries in 3 weeks as planned and As needed

## 2011-10-23 NOTE — Progress Notes (Signed)
Quick Note:  Called spoke with patient, advised of cxr results / recs as stated by TP. Pt verbalized her understanding and denied any questions. Pt reports PCP aware of compression fx, has ov w/ her today. Copy of cxr sent via biscom. ______

## 2011-10-23 NOTE — Progress Notes (Signed)
  Subjective:  71 yo female with known hx of  severe COPD.  She uses 2 L O2 continuosly.  Her 2009 spirometry showed severe obstruction (32% pred) and a DLCO of 40% pred.  She has been on chronic steroids for years.   10/18/11 Acute OV  Complains of Increased sob for 2 weeks since weaning down on  prednsione Complains of productive cough w/ on/off green mucus and intermittent  wheezing - Denies chest tightness or fever Was on levaquin 1 month ago for similar symptoms.  Remains on  Daily zithromax .  No chest pain or increased edema.     Review of Systems  Constitutional:   No  weight loss, night sweats,  Fevers, chills,  +fatigue, or  lassitude.  HEENT:   No headaches,  Difficulty swallowing,  Tooth/dental problems, or  Sore throat,                No sneezing, itching, ear ache, nasal congestion, post nasal drip,   CV:  No chest pain,  Orthopnea, PND, swelling in lower extremities, anasarca, dizziness, palpitations, syncope.   GI  No heartburn, indigestion, abdominal pain, nausea, vomiting, diarrhea, change in bowel habits, loss of appetite, bloody stools.   Resp:   No coughing up of blood.  N  No chest wall deformity  Skin: no rash or lesions.  GU: no dysuria, change in color of urine, no urgency or frequency.  No flank pain, no hematuria   MS:  No joint pain or swelling.  No decreased range of motion.    Psych:  No change in mood or affect. No depression or anxiety.  No memory loss.          Objective:   Physical Exam    Gen: chronically ill appearing, cushingoid appearance HEENT: NCAT, neck supple without masses; however buffalo hump noted PULM coarse BS , faint wheeze  CV: RRR, slight systolic murmur, no JVD AB: BS+, soft, nontender, no hsm Ext: warm, trace edema, no clubbing, no cyanosis Derm: thin skin, multiple bruises      cxr 10/18/11 -chronic changes     Assessment & Plan:

## 2011-10-23 NOTE — Assessment & Plan Note (Signed)
Flare  CXR w/ no acute changes   Plan ncrease Prednisone 40mg  daily for 5 days then 30mg  daily for 5 days then hold at 20mg  daily  Continue on Zithromax daily  I will call with xray results.  Please contact office for sooner follow up if symptoms do not improve or worsen or seek emergency care  follow up  Dr. Kendrick Fries in 3 weeks as planned and As needed

## 2011-10-29 ENCOUNTER — Other Ambulatory Visit: Payer: Self-pay | Admitting: Adult Health

## 2011-11-01 ENCOUNTER — Other Ambulatory Visit: Payer: Self-pay | Admitting: Adult Health

## 2011-11-01 ENCOUNTER — Other Ambulatory Visit: Payer: Self-pay | Admitting: *Deleted

## 2011-11-01 MED ORDER — ALBUTEROL SULFATE HFA 108 (90 BASE) MCG/ACT IN AERS: 2.0000 | INHALATION_SPRAY | Freq: Four times a day (QID) | RESPIRATORY_TRACT | Status: AC | PRN

## 2011-11-05 ENCOUNTER — Encounter: Payer: Self-pay | Admitting: Pulmonary Disease

## 2011-11-05 ENCOUNTER — Ambulatory Visit (INDEPENDENT_AMBULATORY_CARE_PROVIDER_SITE_OTHER): Payer: Medicare Other | Admitting: Pulmonary Disease

## 2011-11-05 VITALS — BP 128/74 | HR 97 | Temp 97.9°F | Ht 63.25 in | Wt 157.1 lb

## 2011-11-05 DIAGNOSIS — J9611 Chronic respiratory failure with hypoxia: Secondary | ICD-10-CM

## 2011-11-05 DIAGNOSIS — J449 Chronic obstructive pulmonary disease, unspecified: Secondary | ICD-10-CM

## 2011-11-05 DIAGNOSIS — J9610 Chronic respiratory failure, unspecified whether with hypoxia or hypercapnia: Secondary | ICD-10-CM

## 2011-11-05 MED ORDER — PREDNISONE 20 MG PO TABS: 20.0000 mg | ORAL_TABLET | Freq: Every day | ORAL | Status: DC

## 2011-11-05 NOTE — Patient Instructions (Signed)
Continue taking the prednisone 20mg  daily as you are doing. Stop azithromycin. Be sure to use your oxygen at 3 L when you do any walking.  You should really only take it off at rest. We will see you back in 2 months (December 2013).

## 2011-11-05 NOTE — Assessment & Plan Note (Signed)
Unfortunately Tina Castro had an exacerbation of her COPD 2 weeks ago.  I am not convinced that the addition of daily azithromycin has made any difference here.  Unfortunately she has not been able to tolerate a dose of daily prednisone less then 20 mg.  I also don't think she is using her oxygen as frequently as she should.  Plan: -I encouraged her to use her oxygen with any exercise including minimal walking -Continue prednisone at 20 mg daily indefinitely -Stop azithromycin -Continue other therapies. -We will see her back in 2 months time

## 2011-11-05 NOTE — Progress Notes (Signed)
Synopsis: Tina Castro first saw [name] in [place] in early 2013 and was diagnosed with severe COPD.  She uses 2 L O2 continuosly.  Her 2009 spirometry showed severe obstruction (32% pred) and a DLCO of 40% pred.  She has been on chronic steroids for years.  Subjective:    Patient ID: Tina Castro, female    DOB: [birth year], 71 y.o.   MRN: [id number]  HPI   06/11/2011 ROV -- Fiora states that her breathing has been better but she has been having hip pain, which is limiting her walking.  This is slowly improving with aleve.  Her cough has improved, and she is only using O2 with exertion and at sleep.  No leg swelling, no chest pain.  She has been using perforomist, but not pulmicort.  She has been using tiotropium.    09/13/11 ROV-- Tina Castro states that she has been doing fairly well.  Some days are better than others.  She thinks that the albuterol isn't quite strong enough because it doesn't last very long.  She is continuing to use her spiriva and perforomist, but it's not clear to me if she is taking the pulmicort. She states that she decreased her prednisone to 15 mg after the last visit. For the last 3 weeks she's had a cold with a cough productive of green sputum. She has not had sinus symptoms, fever, or increasing wheezing or shortness of breath. She continues to take the azithromycin daily and has not had hearing trouble while taking this.  10/18/2011 Acute office visit AE COPD, Rx prednisone and Levaquin  11/05/2011 ROV -- Tina Castro is doing well since being given the prescription for Levaquin and he increased dose of prednisone. Her cough persists but is now only productive of scant sputum which is her baseline. She continues to itch all over which has continued for the last 3 months. Her primary care physician stopped the hydrochlorothiazide but she says that this did not help. Her shortness of breath has improved now that she is back on 20 mg of prednisone daily. She is aware that she  has a compression fracture but she has minimal pain from this.  Review of Systems  HENT: Negative for congestion, rhinorrhea, sneezing and postnasal drip.   Respiratory: Positive for shortness of breath. Negative for cough, chest tightness and wheezing.   Cardiovascular: Negative for chest pain and leg swelling.       Objective:   Physical Exam   Filed Vitals:   11/05/11 1624  BP: 128/74  Pulse: 97  Temp: 97.9 F (36.6 C)  TempSrc: Oral  Height: 5' 3.25" (1.607 m)  Weight: 157 lb 1.9 oz (71.269 kg)  SpO2: 94%    Gen: chronically ill appearing, cushingoid appearance HEENT: NCAT, PERRL, EOMi, OP clear, neck supple without masses; however buffalo hump noted PULM: Insp crackles in bases, few wheezes in upper lobes CV: RRR, slight systolic murmur, no JVD AB: BS+, soft, nontender, no hsm Ext: warm, trace edema, no clubbing, no cyanosis Derm: thin skin, multiple bruises  2009 PFT's with severe obstruction (32% predicted) and DLCO of 40% predicted  09/16/2013 EKG QTc interval 422  11/05/2011 walked 75 feet in the office and her oxygen level dropped to 87%.    Assessment & Plan:   CHRONIC OBSTRUCTIVE PULMONARY DISEASE, SEVERE Unfortunately Tina Castro had an exacerbation of her COPD 2 weeks ago.  I am not convinced that the addition of daily azithromycin has made any difference here.  Unfortunately she  has not been able to tolerate a dose of daily prednisone less then 20 mg.  I also don't think she is using her oxygen as frequently as she should.  Plan: -I encouraged her to use her oxygen with any exercise including minimal walking -Continue prednisone at 20 mg daily indefinitely -Stop azithromycin -Continue other therapies. -We will see her back in 2 months time   Chronic Hypoxemic Respiratory Failure Plan: -3L O2 with exertion and with sleep  Updated Medication List Outpatient Encounter Prescriptions as of 11/05/2011  Medication Sig Dispense Refill  . albuterol  (PROAIR HFA) 108 (90 BASE) MCG/ACT inhaler Inhale 2 puffs into the lungs every 6 (six) hours as needed.  8.5 g  5  . albuterol (PROVENTIL) (2.5 MG/3ML) 0.083% nebulizer solution Take 3 mLs (2.5 mg total) by nebulization every 6 (six) hours as needed for wheezing.  75 mL  12  . alendronate (FOSAMAX) 70 MG tablet Take 70 mg by mouth every 7 (seven) days. Take with a full glass of water on an empty stomach.      Marland Kitchen atorvastatin (LIPITOR) 10 MG tablet Take 10 mg by mouth daily.      Marland Kitchen azithromycin (ZITHROMAX Z-PAK) 250 MG tablet Take 1 tablet (250 mg total) by mouth daily.  30 each  2  . budesonide (PULMICORT) 0.25 MG/2ML nebulizer solution Take 0.25 mg by nebulization 2 (two) times daily.      . formoterol (PERFOROMIST) 20 MCG/2ML nebulizer solution Take 2 mLs (20 mcg total) by nebulization 2 (two) times daily.  120 mL  6  . hydrOXYzine (ATARAX/VISTARIL) 25 MG tablet Take 25 mg by mouth every 6 (six) hours as needed.      . naproxen sodium (ANAPROX) 220 MG tablet Take 220 mg by mouth 2 (two) times daily as needed.      Marland Kitchen olmesartan-hydrochlorothiazide (BENICAR HCT) 40-25 MG per tablet Take 1 tablet by mouth daily.      . potassium chloride (K-DUR) 10 MEQ tablet Take 10 mEq by mouth daily.      . predniSONE (DELTASONE) 10 MG tablet Take 4 tab x 5 days, 3 tabs for 5 days, then take 2 tabs then hold  40 tablet  0  . predniSONE (DELTASONE) 5 MG tablet Take 15 mg by mouth daily.      Marland Kitchen tiotropium (SPIRIVA) 18 MCG inhalation capsule Place 1 capsule (18 mcg total) into inhaler and inhale daily.  30 capsule  6  . verapamil (VERELAN PM) 240 MG 24 hr capsule Take 240 mg by mouth at bedtime.      . Vitamin D, Ergocalciferol, (DRISDOL) 50000 UNITS CAPS Take 50,000 Units by mouth every 7 (seven) days.

## 2011-11-06 ENCOUNTER — Encounter: Payer: Self-pay | Admitting: Pulmonary Disease

## 2011-12-25 ENCOUNTER — Ambulatory Visit: Payer: Medicare Other | Admitting: Pulmonary Disease

## 2011-12-25 ENCOUNTER — Telehealth: Payer: Self-pay | Admitting: Pulmonary Disease

## 2011-12-25 MED ORDER — TIOTROPIUM BROMIDE MONOHYDRATE 18 MCG IN CAPS: 18.0000 ug | ORAL_CAPSULE | Freq: Every day | RESPIRATORY_TRACT | Status: DC

## 2011-12-25 NOTE — Telephone Encounter (Signed)
Refill x 1 only- needs ov

## 2011-12-25 NOTE — Progress Notes (Signed)
No show

## 2012-01-15 ENCOUNTER — Ambulatory Visit (INDEPENDENT_AMBULATORY_CARE_PROVIDER_SITE_OTHER): Payer: Medicare Other | Admitting: Pulmonary Disease

## 2012-01-15 ENCOUNTER — Ambulatory Visit: Payer: Medicare Other | Admitting: Pulmonary Disease

## 2012-01-15 ENCOUNTER — Telehealth: Payer: Self-pay | Admitting: Pulmonary Disease

## 2012-01-15 ENCOUNTER — Encounter: Payer: Self-pay | Admitting: Pulmonary Disease

## 2012-01-15 ENCOUNTER — Ambulatory Visit (INDEPENDENT_AMBULATORY_CARE_PROVIDER_SITE_OTHER)
Admission: RE | Admit: 2012-01-15 | Discharge: 2012-01-15 | Disposition: A | Discharge status: Home / Self Care | Payer: Medicare Other | Source: Ambulatory Visit | Attending: Pulmonary Disease | Admitting: Pulmonary Disease

## 2012-01-15 VITALS — BP 130/60 | HR 119 | Temp 97.9°F | Ht 64.0 in | Wt 150.0 lb

## 2012-01-15 DIAGNOSIS — J449 Chronic obstructive pulmonary disease, unspecified: Secondary | ICD-10-CM

## 2012-01-15 DIAGNOSIS — R05 Cough: Secondary | ICD-10-CM

## 2012-01-15 DIAGNOSIS — L299 Pruritus, unspecified: Secondary | ICD-10-CM

## 2012-01-15 DIAGNOSIS — R0602 Shortness of breath: Secondary | ICD-10-CM

## 2012-01-15 LAB — CBC WITH DIFFERENTIAL/PLATELET
Basophils Absolute: 0 10*3/uL (ref 0.0–0.1)
Eosinophils Absolute: 0.7 10*3/uL (ref 0.0–0.7)
Hemoglobin: 13.9 g/dL (ref 12.0–15.0)
Lymphocytes Relative: 24.7 % (ref 12.0–46.0)
MCHC: 32.6 g/dL (ref 30.0–36.0)
Monocytes Relative: 6.6 % (ref 3.0–12.0)
Neutro Abs: 8.1 10*3/uL — ABNORMAL HIGH (ref 1.4–7.7)
Neutrophils Relative %: 62.8 % (ref 43.0–77.0)
RBC: 4.75 Mil/uL (ref 3.87–5.11)
RDW: 15.1 % — ABNORMAL HIGH (ref 11.5–14.6)

## 2012-01-15 LAB — POCT INFLUENZA A/B: Influenza B, POC: NEGATIVE

## 2012-01-15 MED ORDER — PREDNISONE 20 MG PO TABS: ORAL_TABLET | ORAL | Status: DC

## 2012-01-15 MED ORDER — LEVOFLOXACIN 750 MG PO TABS: 750.0000 mg | ORAL_TABLET | Freq: Every day | ORAL | Status: AC

## 2012-01-15 NOTE — Progress Notes (Signed)
Synopsis: [name] first saw [name] in [place] in early 2013 and was diagnosed with severe COPD.  She uses 2 L O2 continuosly.  Her 2009 spirometry showed severe obstruction (32% pred) and a DLCO of 40% pred.  She has been on chronic steroids for years.  Subjective:    Patient ID: Tina Castro, female    DOB: 18-Nov-1940, 72 y.o.   MRN: [id number]  HPI   06/11/2011 ROV -- Erminia states that her breathing has been better but she has been having hip pain, which is limiting her walking.  This is slowly improving with aleve.  Her cough has improved, and she is only using O2 with exertion and at sleep.  No leg swelling, no chest pain.  She has been using perforomist, but not pulmicort.  She has been using tiotropium.    09/13/11 ROV-- [name] states that she has been doing fairly well.  Some days are better than others.  She thinks that the albuterol isn't quite strong enough because it doesn't last very long.  She is continuing to use her spiriva and perforomist, but it's not clear to me if she is taking the pulmicort. She states that she decreased her prednisone to 15 mg after the last visit. For the last 3 weeks she's had a cold with a cough productive of green sputum. She has not had sinus symptoms, fever, or increasing wheezing or shortness of breath. She continues to take the azithromycin daily and has not had hearing trouble while taking this.  10/18/2011 Acute office visit AE COPD, Rx prednisone and Levaquin  11/05/2011 ROV -- [name] is doing well since being given the prescription for Levaquin and he increased dose of prednisone. Her cough persists but is now only productive of scant sputum which is her baseline. She continues to itch all over which has continued for the last 3 months. Her primary care physician stopped the hydrochlorothiazide but she says that this did not help. Her shortness of breath has improved now that she is back on 20 mg of prednisone daily. She is aware that she  has a compression fracture but she has minimal pain from this.  01/15/2011 ROV -- [name] returns to clinic today stating that she's been more short of breath for the last week. She has not had a fever or chills or sinus symptoms. However she has had increasing shortness of breath, wheezing, sputum production, and cough. She received a flu shot this year. She felt like she should have gone to the doctor last week but she waited until she could see [place] today. Prior to this she was doing okay, still taking prednisone 20 mg daily. She still uses and benefits from 3 L of oxygen continuously.   Review of Systems  HENT: Negative for congestion, rhinorrhea, sneezing and postnasal drip.   Respiratory: Positive for shortness of breath. Negative for cough, chest tightness and wheezing.   Cardiovascular: Negative for chest pain and leg swelling.       Objective:   Physical Exam   Filed Vitals:   01/15/12 1354  BP: 130/60  Pulse: 119  Temp: 97.9 F (36.6 C)  TempSrc: Oral  Height: 5\' 4"  (1.626 m)  Weight: 150 lb (68.04 kg)  SpO2: 91%    Gen: chronically ill appearing, cushingoid appearance HEENT: NCAT, PERRL, EOMi, OP clear, neck supple without masses; however buffalo hump noted PULM: Insp crackles in bases, few wheezes in upper lobes CV: RRR, slight systolic murmur, no JVD AB:  BS+, soft, nontender, no hsm Ext: warm, trace edema, no clubbing, no cyanosis Derm: thin skin, multiple bruises  2009 PFT's with severe obstruction (32% predicted) and DLCO of 40% predicted  09/16/2013 EKG QTc interval 422  11/05/2011 walked 75 feet in the office and her oxygen level dropped to 87%.    Assessment & Plan:   CHRONIC OBSTRUCTIVE PULMONARY DISEASE, SEVERE Channa is currently having a flare of her COPD.  She is currently oxygenating OK and does not have clear signs of pneumonia on exam (no fever, focal crackles, etc).  She wants to treat this as an outpatient but I advised her that she should go to an ED  if she gets worse given the severity of her COPD.  Plan: -Check Rapid flu now -CXR today -duoneb in office -prednisone taper -Levaquin for 7 days -GO TO ER if she gets worse -f/u 2-3 weeks  Itching We've tried changing various meds to no avail.  I worry that she could be developing polycythemia as prednisone doesn't seem to help.    Plan: -check CBC with diff -derm referral if that test is unrevealing    Updated Medication List Outpatient Encounter Prescriptions as of 01/15/2012  Medication Sig Dispense Refill  . albuterol (PROAIR HFA) 108 (90 BASE) MCG/ACT inhaler Inhale 2 puffs into the lungs every 6 (six) hours as needed.  8.5 g  5  . albuterol (PROVENTIL) (2.5 MG/3ML) 0.083% nebulizer solution Take 3 mLs (2.5 mg total) by nebulization every 6 (six) hours as needed for wheezing.  75 mL  12  . alendronate (FOSAMAX) 70 MG tablet Take 70 mg by mouth every 7 (seven) days. Take with a full glass of water on an empty stomach.      Marland Kitchen atorvastatin (LIPITOR) 10 MG tablet Take 10 mg by mouth daily.      . formoterol (PERFOROMIST) 20 MCG/2ML nebulizer solution Take 2 mLs (20 mcg total) by nebulization 2 (two) times daily.  120 mL  6  . naproxen sodium (ANAPROX) 220 MG tablet Take 220 mg by mouth 2 (two) times daily as needed.      Marland Kitchen olmesartan-hydrochlorothiazide (BENICAR HCT) 40-25 MG per tablet Take 1 tablet by mouth daily.      . potassium chloride (K-DUR) 10 MEQ tablet Take 10 mEq by mouth daily.      . predniSONE (DELTASONE) 20 MG tablet Take 1 tablet (20 mg total) by mouth daily.  30 tablet  3  . tiotropium (SPIRIVA) 18 MCG inhalation capsule Place 1 capsule (18 mcg total) into inhaler and inhale daily.  30 capsule  0  . verapamil (VERELAN PM) 240 MG 24 hr capsule Take 240 mg by mouth at bedtime.      . budesonide (PULMICORT) 0.25 MG/2ML nebulizer solution Take 0.25 mg by nebulization 2 (two) times daily.      . [DISCONTINUED] azithromycin (ZITHROMAX Z-PAK) 250 MG tablet Take 1 tablet  (250 mg total) by mouth daily.  30 each  2  . [DISCONTINUED] hydrOXYzine (ATARAX/VISTARIL) 25 MG tablet Take 25 mg by mouth every 6 (six) hours as needed.

## 2012-01-15 NOTE — Patient Instructions (Addendum)
Take the extra prednisone as prescribed for 12 days, then resume taking your regular dose of prednisone Take the Levaquin for 7 days If you get worse at all go to the Emergency Room Continue taking all of your other medications, including the budesonide inhaled We need to draw your blood to evaluate the itching, but this can wait until after you are better We recommend that you change your primary care doctor to the doctors here at our office We will see you back in 2-3 weeks.

## 2012-01-15 NOTE — Assessment & Plan Note (Signed)
Tina Castro is currently having a flare of her COPD.  She is currently oxygenating OK and does not have clear signs of pneumonia on exam (no fever, focal crackles, etc).  She wants to treat this as an outpatient but I advised her that she should go to an ED if she gets worse given the severity of her COPD.  Plan: -Check Rapid flu now -CXR today -duoneb in office -prednisone taper -Levaquin for 7 days -GO TO ER if she gets worse -f/u 2-3 weeks

## 2012-01-15 NOTE — Telephone Encounter (Signed)
I called the rx in to the pharmacy since looks like it printed instead of going electronically

## 2012-01-15 NOTE — Assessment & Plan Note (Signed)
We've tried changing various meds to no avail.  I worry that she could be developing polycythemia as prednisone doesn't seem to help.    Plan: -check CBC with diff -derm referral if that test is unrevealing

## 2012-01-17 NOTE — Progress Notes (Signed)
Quick Note:  Spoke with pt and notified of results per Dr. McQuaid. Pt verbalized understanding and denied any questions.  ______ 

## 2012-02-25 ENCOUNTER — Encounter: Payer: Self-pay | Admitting: Pulmonary Disease

## 2012-02-25 ENCOUNTER — Ambulatory Visit (INDEPENDENT_AMBULATORY_CARE_PROVIDER_SITE_OTHER): Payer: Medicare Other | Admitting: Pulmonary Disease

## 2012-02-25 VITALS — BP 140/90 | HR 102 | Temp 98.2°F | Ht 63.75 in | Wt 157.0 lb

## 2012-02-25 DIAGNOSIS — R21 Rash and other nonspecific skin eruption: Secondary | ICD-10-CM

## 2012-02-25 DIAGNOSIS — J9610 Chronic respiratory failure, unspecified whether with hypoxia or hypercapnia: Secondary | ICD-10-CM

## 2012-02-25 DIAGNOSIS — J9611 Chronic respiratory failure with hypoxia: Secondary | ICD-10-CM

## 2012-02-25 DIAGNOSIS — J449 Chronic obstructive pulmonary disease, unspecified: Secondary | ICD-10-CM

## 2012-02-25 DIAGNOSIS — L299 Pruritus, unspecified: Secondary | ICD-10-CM

## 2012-02-25 MED ORDER — PREDNISONE 20 MG PO TABS: 20.0000 mg | ORAL_TABLET | Freq: Every day | ORAL | Status: DC

## 2012-02-25 NOTE — Progress Notes (Signed)
Synopsis: Tina Castro first saw Tina Castro in [place] in early 2013 and was diagnosed with severe COPD.  She uses 2 L O2 continuosly.  Her 2009 spirometry showed severe obstruction (32% pred) and a DLCO of 40% pred.  She has been on chronic steroids for years.  Subjective:    Patient ID: Tina Castro, female    DOB: 08-27-1940, 72 y.o.   MRN: [id number]  HPI   06/11/2011 ROV -- Elmire states that her breathing has been better but she has been having hip pain, which is limiting her walking.  This is slowly improving with aleve.  Her cough has improved, and she is only using O2 with exertion and at sleep.  No leg swelling, no chest pain.  She has been using perforomist, but not pulmicort.  She has been using tiotropium.    09/13/11 ROV-- Tina Castro states that she has been doing fairly well.  Some days are better than others.  She thinks that the albuterol isn't quite strong enough because it doesn't last very long.  She is continuing to use her spiriva and perforomist, but it's not clear to me if she is taking the pulmicort. She states that she decreased her prednisone to 15 mg after the last visit. For the last 3 weeks she's had a cold with a cough productive of green sputum. She has not had sinus symptoms, fever, or increasing wheezing or shortness of breath. She continues to take the azithromycin daily and has not had hearing trouble while taking this.  10/18/2011 Acute office visit AE COPD, Rx prednisone and Levaquin  11/05/2011 ROV -- [name] is doing well since being given the prescription for Levaquin and he increased dose of prednisone. Her cough persists but is now only productive of scant sputum which is her baseline. She continues to itch all over which has continued for the last 3 months. Her primary care physician stopped the hydrochlorothiazide but she says that this did not help. Her shortness of breath has improved now that she is back on 20 mg of prednisone daily. She is aware that she  has a compression fracture but she has minimal pain from this.  01/15/2011 ROV -- Tina Castro returns to clinic today stating that she's been more short of breath for the last week. She has not had a fever or chills or sinus symptoms. However she has had increasing shortness of breath, wheezing, sputum production, and cough. She received a flu shot this year. She felt like she should have gone to the doctor last week but she waited until she could see [place] today. Prior to this she was doing okay, still taking prednisone 20 mg daily. She still uses and benefits from 3 L of oxygen continuously.  02/25/12 ROV -- Since our last visit she initially recovered from the cough and shortness of breath of which she complained in January 2014. However approximately one week prior to this visit she developed increasing shortness of breath. This is been associated with green sputum production and no fevers or chest pain. She's not swelling in her legs. She continues to take prednisone daily and increased it to 50 mg daily on February 13. Since increasing the dose or wheezing and shortness of breath have improved. However she feels that she has not returned to her baseline.    Past Medical History  Diagnosis Date  . COPD (chronic obstructive Castro disease)   . Hypertension   . Hyperlipidemia      Review of Systems  HENT: Negative for congestion, rhinorrhea, sneezing and postnasal drip.   Respiratory: Positive for shortness of breath. Negative for cough, chest tightness and wheezing.   Cardiovascular: Negative for chest pain and leg swelling.       Objective:   Physical Exam   Filed Vitals:   02/25/12 0953  BP: 140/90  Pulse: 102  Temp: 98.2 F (36.8 C)  TempSrc: Oral  Height: 5' 3.75" (1.619 m)  Weight: 157 Tina (71.215 kg)  SpO2: 94%    Gen: chronically ill appearing, cushingoid appearance HEENT: NCAT, PERRL, EOMi, OP clear, neck supple without masses; however buffalo hump noted PULM: wheezing  bilaterally.  CV: RRR, slight systolic murmur, no JVD AB: BS+, soft, nontender, no hsm Ext: warm, trace edema, no clubbing, no cyanosis Derm: thin skin, multiple bruises  2009 PFT's with severe obstruction (32% predicted) and DLCO of 40% predicted  09/16/2013 EKG QTc interval 422  11/05/2011 walked 75 feet in the office and her oxygen level dropped to 87%.  01/2012 CXR >> no infiltrate or masses    Assessment & Plan:   CHRONIC OBSTRUCTIVE Castro DISEASE, SEVERE Darly initially recovered with the levaquin and prednisone taper that we used on the last visit, but unfortunately developed another flare last week.  Her dyspnea has improved on the higher dose of prednisone, but she still has green sputum production likely due to bacterial bronchitis.    I introduced the concept of using morphine for dyspnea today.  Tina Castro has advanced COPD and may be needing hospice in the next 6-12 months. She and her husband were not really ready to hear this, but I think that we may need to head in that direction this year.  Plan: -pred taper back to 20mg  daily prednisone -levaquin again -continue perforomist, pulmicort, spiriva -pulm rehab encouraged again, she is not interested  Chronic hypoxemic respiratory failure Continue 3L continuously.  Steroid dependence Request records of DEXA from 2010 or 2011.  Continue fosamax. She is truly dependent on this both psychologically and physically.  I'm not sure we'll ever be able to convince her to take less than 20mg  daily.  Itching This has continued to be a problem for her despite multiple anti-histamines and creams.  I suspect polycythemia.  Will refer to dermatology for evaluation.    Updated Medication List Outpatient Encounter Prescriptions as of 02/25/2012  Medication Sig Dispense Refill  . albuterol (PROAIR HFA) 108 (90 BASE) MCG/ACT inhaler Inhale 2 puffs into the lungs every 6 (six) hours as needed.  8.5 g  5  . albuterol (PROVENTIL) (2.5  MG/3ML) 0.083% nebulizer solution Take 3 mLs (2.5 mg total) by nebulization every 6 (six) hours as needed for wheezing.  75 mL  12  . alendronate (FOSAMAX) 70 MG tablet Take 70 mg by mouth every 7 (seven) days. Take with a full glass of water on an empty stomach.      Marland Kitchen atorvastatin (LIPITOR) 10 MG tablet Take 10 mg by mouth daily.      . budesonide (PULMICORT) 0.25 MG/2ML nebulizer solution Take 0.25 mg by nebulization 2 (two) times daily.      . formoterol (PERFOROMIST) 20 MCG/2ML nebulizer solution Take 2 mLs (20 mcg total) by nebulization 2 (two) times daily.  120 mL  6  . naproxen sodium (ANAPROX) 220 MG tablet Take 220 mg by mouth 2 (two) times daily as needed.      Marland Kitchen olmesartan-hydrochlorothiazide (BENICAR HCT) 40-25 MG per tablet Take 1 tablet by mouth daily.      Marland Kitchen  potassium chloride (K-DUR) 10 MEQ tablet Take 10 mEq by mouth daily.      . predniSONE (DELTASONE) 20 MG tablet Take 1 tablet (20 mg total) by mouth daily.  30 tablet  3  . predniSONE (DELTASONE) 20 MG tablet This is a taper to be taken in place of chronic home prednisone Prednisone 60mg  daily for three days, Then 50mg  daily for three days Then 40mg  daily for three days, Then 30mg  daily for three days,  Then resume home dose of 20mg  prednisone daily indefinitely  30 tablet  0  . tiotropium (SPIRIVA) 18 MCG inhalation capsule Place 1 capsule (18 mcg total) into inhaler and inhale daily.  30 capsule  0  . verapamil (VERELAN PM) 240 MG 24 hr capsule Take 240 mg by mouth at bedtime.       No facility-administered encounter medications on file as of 02/25/2012.

## 2012-02-25 NOTE — Assessment & Plan Note (Signed)
Tina Castro initially recovered with the levaquin and prednisone taper that we used on the last visit, but unfortunately developed another flare last week.  Her dyspnea has improved on the higher dose of prednisone, but she still has green sputum production likely due to bacterial bronchitis.    I introduced the concept of using morphine for dyspnea today.  [name] has advanced COPD and may be needing hospice in the next 6-12 months. She and her husband were not really ready to hear this, but I think that we may need to head in that direction this year.  Plan: -pred taper back to 20mg  daily prednisone -levaquin again -continue perforomist, pulmicort, spiriva -pulm rehab encouraged again, she is not interested

## 2012-02-25 NOTE — Assessment & Plan Note (Signed)
Continue 3 L continuously 

## 2012-02-25 NOTE — Patient Instructions (Signed)
Take the prednisone taper then go back to 25mg  for a week or two, then back to 20mg  daily. Take the Levaquin as written Keep using your other meds. We will refer you to a dermatologist for your skin redness and itching We ill see you back one month

## 2012-02-25 NOTE — Assessment & Plan Note (Signed)
This has continued to be a problem for her despite multiple anti-histamines and creams.  I suspect polycythemia.  Will refer to dermatology for evaluation.

## 2012-02-25 NOTE — Assessment & Plan Note (Signed)
Request records of DEXA from 2010 or 2011.  Continue fosamax. She is truly dependent on this both psychologically and physically.  I'm not sure we'll ever be able to convince her to take less than 20mg  daily.

## 2012-03-06 ENCOUNTER — Other Ambulatory Visit: Payer: Self-pay | Admitting: *Deleted

## 2012-03-06 MED ORDER — TIOTROPIUM BROMIDE MONOHYDRATE 18 MCG IN CAPS: 18.0000 ug | ORAL_CAPSULE | Freq: Every day | RESPIRATORY_TRACT | Status: DC

## 2012-03-25 ENCOUNTER — Ambulatory Visit: Payer: Medicare Other | Admitting: Pulmonary Disease

## 2012-04-08 ENCOUNTER — Ambulatory Visit: Payer: Medicare Other | Admitting: Pulmonary Disease

## 2012-04-21 ENCOUNTER — Ambulatory Visit: Payer: Self-pay | Admitting: Family Medicine

## 2012-05-06 ENCOUNTER — Ambulatory Visit: Payer: Medicare Other | Admitting: Pulmonary Disease

## 2012-05-13 ENCOUNTER — Encounter: Payer: Self-pay | Admitting: Pulmonary Disease

## 2012-05-13 ENCOUNTER — Ambulatory Visit (INDEPENDENT_AMBULATORY_CARE_PROVIDER_SITE_OTHER): Payer: Medicare Other | Admitting: Pulmonary Disease

## 2012-05-13 VITALS — BP 162/70 | HR 108 | Temp 97.4°F | Ht 64.0 in | Wt 159.0 lb

## 2012-05-13 DIAGNOSIS — L299 Pruritus, unspecified: Secondary | ICD-10-CM

## 2012-05-13 DIAGNOSIS — J449 Chronic obstructive pulmonary disease, unspecified: Secondary | ICD-10-CM

## 2012-05-13 DIAGNOSIS — J9611 Chronic respiratory failure with hypoxia: Secondary | ICD-10-CM

## 2012-05-13 DIAGNOSIS — J9610 Chronic respiratory failure, unspecified whether with hypoxia or hypercapnia: Secondary | ICD-10-CM

## 2012-05-13 MED ORDER — DOXYCYCLINE HYCLATE 50 MG PO CAPS: 100.0000 mg | ORAL_CAPSULE | Freq: Two times a day (BID) | ORAL | Status: DC

## 2012-05-13 MED ORDER — BUDESONIDE 0.25 MG/2ML IN SUSP: 0.2500 mg | Freq: Two times a day (BID) | RESPIRATORY_TRACT | Status: DC

## 2012-05-13 NOTE — Progress Notes (Signed)
Synopsis: Ms. Spiewak first saw [name] in [place] in early 2013 and was diagnosed with severe COPD.  She uses 2 L O2 continuosly.  Her 2009 spirometry showed severe obstruction (32% pred) and a DLCO of 40% pred.  She has been on chronic steroids for years.  We attempted a taper of prednisone in the Spring of 2013 and could not go lower than 10mg .    Subjective:    Patient ID: Tina Castro, female    DOB: 04/09/1940, 72 y.o.   MRN: [id number]  HPI   06/11/2011 ROV -- Cressie states that her breathing has been better but she has been having hip pain, which is limiting her walking.  This is slowly improving with aleve.  Her cough has improved, and she is only using O2 with exertion and at sleep.  No leg swelling, no chest pain.  She has been using perforomist, but not pulmicort.  She has been using tiotropium.    09/13/11 ROV-- Ms. Mish states that she has been doing fairly well.  Some days are better than others.  She thinks that the albuterol isn't quite strong enough because it doesn't last very long.  She is continuing to use her spiriva and perforomist, but it's not clear to me if she is taking the pulmicort. She states that she decreased her prednisone to 15 mg after the last visit. For the last 3 weeks she's had a cold with a cough productive of green sputum. She has not had sinus symptoms, fever, or increasing wheezing or shortness of breath. She continues to take the azithromycin daily and has not had hearing trouble while taking this.  10/18/2011 Acute office visit AE COPD, Rx prednisone and Levaquin  11/05/2011 ROV -- Mrs. Muilenburg is doing well since being given the prescription for Levaquin and he increased dose of prednisone. Her cough persists but is now only productive of scant sputum which is her baseline. She continues to itch all over which has continued for the last 3 months. Her primary care physician stopped the hydrochlorothiazide but she says that this did not help. Her shortness of  breath has improved now that she is back on 20 mg of prednisone daily. She is aware that she has a compression fracture but she has minimal pain from this.  01/15/2011 ROV -- Gillian returns to clinic today stating that she's been more short of breath for the last week. She has not had a fever or chills or sinus symptoms. However she has had increasing shortness of breath, wheezing, sputum production, and cough. She received a flu shot this year. She felt like she should have gone to the doctor last week but she waited until she could see [place] today. Prior to this she was doing okay, still taking prednisone 20 mg daily. She still uses and benefits from 3 L of oxygen continuously.  02/25/12 ROV -- Since our last visit she initially recovered from the cough and shortness of breath of which she complained in January 2014. However approximately one week prior to this visit she developed increasing shortness of breath. This is been associated with green sputum production and no fevers or chest pain. She's not swelling in her legs. She continues to take prednisone daily and increased it to 50 mg daily on February 13. Since increasing the dose or wheezing and shortness of breath have improved. However she feels that she has not returned to her baseline.   05/13/12 ROV -- Elige Ko coughing since her  last visit. She's had increased cough, sputum production with light green color but no blood. The chest pain from the rib fracture has improved but she continues to have shortness of breath and increased sputum production from baseline. She continues to use and benefit from her oxygen at 3 L a minute. She has not decreased her dose of Lasix below 20 mg since her last visit. She was seen by her primary care physician recently who made some recommendations for treating the pain. She has not taken any antibiotics lately.   Past Medical History  Diagnosis Date  . COPD (chronic obstructive pulmonary disease)   .  Hypertension   . Hyperlipidemia      Review of Systems  HENT: Negative for congestion, rhinorrhea, sneezing and postnasal drip.   Respiratory: Positive for cough and shortness of breath. Negative for chest tightness and wheezing.   Cardiovascular: Negative for chest pain and leg swelling.       Objective:   Physical Exam   Filed Vitals:   05/13/12 1517  BP: 162/70  Pulse: 108  Temp: 97.4 F (36.3 C)  TempSrc: Oral  Height: 5\' 4"  (1.626 m)  Weight: 159 lb (72.122 kg)  SpO2: 92%   3 L nasal cannula  Gen: chronically ill appearing, cushingoid appearance HEENT: NCAT, EOMi, OP clear,  buffalo hump noted PULM: Few rhonchi bilaterally, normal percussion CV: RRR, slight systolic murmur, no JVD AB: BS+, soft, nontender, no hsm Ext: warm, trace edema, no clubbing, no cyanosis Derm: thin skin, multiple bruises  2009 PFT's with severe obstruction (32% predicted) and DLCO of 40% predicted  09/16/2013 EKG QTc interval 422  11/05/2011 walked 75 feet in the office and her oxygen level dropped to 87%.  01/2012 CXR >> no infiltrate or masses    Assessment & Plan:   CHRONIC OBSTRUCTIVE PULMONARY DISEASE, SEVERE Unfortunately Bedelia has been having a mild flare for the last few weeks and has been coughing up green sputum.  Again, we are at an end stage here.  She and her husband are not ready for hospice.  Plan: -doxycycline 100mg  po bid for 7 days, then one week a month -continue prednisone -continue spiriva, perforomist -resume pulmicort  Chronic hypoxemic respiratory failure Continue 3 L O2 continuously   Itching Told by dermatologist that she has dry skin.  Plan: -cold showers -aquaphor -f/u with derm  Steroid dependence I brought up this issue again today.  She is unwilling to decrease her dose of prednisone lower than 20mg  daily.  It is not clear to me that it is making a difference frankly given the recurrent exacerbations.    Updated Medication  List Outpatient Encounter Prescriptions as of 05/13/2012  Medication Sig Dispense Refill  . albuterol (PROAIR HFA) 108 (90 BASE) MCG/ACT inhaler Inhale 2 puffs into the lungs every 6 (six) hours as needed.  8.5 g  5  . albuterol (PROVENTIL) (2.5 MG/3ML) 0.083% nebulizer solution Take 3 mLs (2.5 mg total) by nebulization every 6 (six) hours as needed for wheezing.  75 mL  12  . alendronate (FOSAMAX) 70 MG tablet Take 70 mg by mouth every 7 (seven) days. Take with a full glass of water on an empty stomach.      Marland Kitchen atorvastatin (LIPITOR) 10 MG tablet Take 10 mg by mouth daily.      . formoterol (PERFOROMIST) 20 MCG/2ML nebulizer solution Take 2 mLs (20 mcg total) by nebulization 2 (two) times daily.  120 mL  6  . naproxen sodium (  ANAPROX) 220 MG tablet Take 220 mg by mouth 2 (two) times daily as needed.      Marland Kitchen olmesartan-hydrochlorothiazide (BENICAR HCT) 40-25 MG per tablet Take 1 tablet by mouth daily.      . potassium chloride (K-DUR) 10 MEQ tablet Take 10 mEq by mouth daily.      . predniSONE (DELTASONE) 20 MG tablet Take 1 tablet (20 mg total) by mouth daily.  30 tablet  3  . tiotropium (SPIRIVA) 18 MCG inhalation capsule Place 1 capsule (18 mcg total) into inhaler and inhale daily.  30 capsule  5  . verapamil (VERELAN PM) 240 MG 24 hr capsule Take 240 mg by mouth at bedtime.      . [DISCONTINUED] budesonide (PULMICORT) 0.25 MG/2ML nebulizer solution Take 0.25 mg by nebulization 2 (two) times daily.      . [DISCONTINUED] predniSONE (DELTASONE) 20 MG tablet This is a taper to be taken in place of chronic home prednisone Prednisone 60mg  daily for three days, Then 50mg  daily for three days Then 40mg  daily for three days, Then 30mg  daily for three days,  Then resume home dose of 20mg  prednisone daily indefinitely  30 tablet  0   No facility-administered encounter medications on file as of 05/13/2012.

## 2012-05-13 NOTE — Patient Instructions (Addendum)
Take the spiriva and perforomist as you are doing Take the prednisone as you are doing Take the doxycycline for one week, then take it for the first seven days of every month  For your dry skin, take cold showers, blot your skin (do not rub) with a towel, and apply aquaphor when your skin is still moist  We will see you back in 2-3 months or sooner if needed

## 2012-05-13 NOTE — Assessment & Plan Note (Signed)
I brought up this issue again today.  She is unwilling to decrease her dose of prednisone lower than 20mg  daily.  It is not clear to me that it is making a difference frankly given the recurrent exacerbations.

## 2012-05-13 NOTE — Assessment & Plan Note (Signed)
Unfortunately Tina Castro has been having a mild flare for the last few weeks and has been coughing up green sputum.  Again, we are at an end stage here.  She and her husband are not ready for hospice.  Plan: -doxycycline 100mg  po bid for 7 days, then one week a month -continue prednisone -continue spiriva, perforomist -resume pulmicort

## 2012-05-13 NOTE — Assessment & Plan Note (Signed)
Continue 3 L O2 continuously 

## 2012-05-13 NOTE — Assessment & Plan Note (Signed)
Told by dermatologist that she has dry skin.  Plan: -cold showers -aquaphor -f/u with derm

## 2012-05-23 ENCOUNTER — Inpatient Hospital Stay: Payer: Self-pay | Admitting: Internal Medicine

## 2012-07-10 ENCOUNTER — Telehealth: Payer: Self-pay | Admitting: Pulmonary Disease

## 2012-07-10 MED ORDER — PREDNISONE 10 MG PO TABS: ORAL_TABLET | ORAL | Status: DC

## 2012-07-10 NOTE — Telephone Encounter (Signed)
Called and spoke with pt and she is aware of CY recs to call in pred taper.  This has been sent in to her pharmacy and nothing further is needed.

## 2012-07-10 NOTE — Telephone Encounter (Signed)
Spoke with pt She is c/o increased DOE with minimal exertion for the past 2 days  She states no increased cough, mucus production, wheezing, f/c/s, CP or chest tightness Declined ov that I offered her this pm  Would like something sent to pharm  Will forward to doc of the day Please advise, Kathleen Argue

## 2012-07-15 ENCOUNTER — Ambulatory Visit (INDEPENDENT_AMBULATORY_CARE_PROVIDER_SITE_OTHER): Payer: Medicare Other | Admitting: Pulmonary Disease

## 2012-07-15 ENCOUNTER — Encounter: Payer: Self-pay | Admitting: Pulmonary Disease

## 2012-07-15 VITALS — BP 144/92 | HR 90 | Temp 97.9°F | Ht 63.5 in | Wt 157.0 lb

## 2012-07-15 DIAGNOSIS — J449 Chronic obstructive pulmonary disease, unspecified: Secondary | ICD-10-CM

## 2012-07-15 MED ORDER — AEROCHAMBER MINI CHAMBER DEVI: 2.0000 | Freq: Two times a day (BID) | Status: DC

## 2012-07-15 MED ORDER — PREDNISONE 5 MG PO TABS: 20.0000 mg | ORAL_TABLET | Freq: Every day | ORAL | Status: DC

## 2012-07-15 MED ORDER — DOXYCYCLINE HYCLATE 50 MG PO CAPS: 100.0000 mg | ORAL_CAPSULE | Freq: Two times a day (BID) | ORAL | Status: AC

## 2012-07-15 NOTE — Patient Instructions (Addendum)
Try stopping the nebulizers and change them to Symbicort 2 puffs twice a day for a week with the spacer.  If you do better on that, then let me know and I will change your prescription.  Take 15mg  of prednisone three times a week and 20mg  four times a week  We will work to get you Roflumilast 500mg  daily to help replace the prednisone  We will see you back in 6-8 weeks or sooner if needed

## 2012-07-15 NOTE — Assessment & Plan Note (Signed)
We discussed this again today. I explained to her that this dose of prednisone really makes no sense and has caused her to experience most known adverse reactions to the drug.  She is psychologically and physiologically dependent on the prednisone and the repeated flares of COPD in the last year have shown [place] that there is no real benefit from this high of a dose.  Plan: -gradually taper down to 15mg  daily,  -start roflumilast again with patient assistance, hopefully will be able to drop pred dose lower

## 2012-07-15 NOTE — Progress Notes (Signed)
Synopsis: [name] first saw [name] in [place] in early 2013 and was diagnosed with severe COPD.  She uses 2 L O2 continuosly.  Her 2009 spirometry showed severe obstruction (32% pred) and a DLCO of 40% pred.  She has been on chronic steroids for years.  We attempted a taper of prednisone in the Spring of 2013 and could not go lower than 10mg .    Subjective:    Patient ID: Tina Castro, female    DOB: [birth year], 72 y.o.   MRN: [id number]  HPI   06/11/2011 ROV -- Shala states that her breathing has been better but she has been having hip pain, which is limiting her walking.  This is slowly improving with aleve.  Her cough has improved, and she is only using O2 with exertion and at sleep.  No leg swelling, no chest pain.  She has been using perforomist, but not pulmicort.  She has been using tiotropium.    09/13/11 ROV-- Ms. Egolf states that she has been doing fairly well.  Some days are better than others.  She thinks that the albuterol isn't quite strong enough because it doesn't last very long.  She is continuing to use her spiriva and perforomist, but it's not clear to me if she is taking the pulmicort. She states that she decreased her prednisone to 15 mg after the last visit. For the last 3 weeks she's had a cold with a cough productive of green sputum. She has not had sinus symptoms, fever, or increasing wheezing or shortness of breath. She continues to take the azithromycin daily and has not had hearing trouble while taking this.  10/18/2011 Acute office visit AE COPD, Rx prednisone and Levaquin  11/05/2011 ROV -- Mrs. Ciaravino is doing well since being given the prescription for Levaquin and he increased dose of prednisone. Her cough persists but is now only productive of scant sputum which is her baseline. She continues to itch all over which has continued for the last 3 months. Her primary care physician stopped the hydrochlorothiazide but she says that this did not help. Her shortness of  breath has improved now that she is back on 20 mg of prednisone daily. She is aware that she has a compression fracture but she has minimal pain from this.  01/15/2011 ROV -- Bryssa returns to clinic today stating that she's been more short of breath for the last week. She has not had a fever or chills or sinus symptoms. However she has had increasing shortness of breath, wheezing, sputum production, and cough. She received a flu shot this year. She felt like she should have gone to the doctor last week but she waited until she could see [place] today. Prior to this she was doing okay, still taking prednisone 20 mg daily. She still uses and benefits from 3 L of oxygen continuously.  02/25/12 ROV -- Since our last visit she initially recovered from the cough and shortness of breath of which she complained in January 2014. However approximately one week prior to this visit she developed increasing shortness of breath. This is been associated with green sputum production and no fevers or chest pain. She's not swelling in her legs. She continues to take prednisone daily and increased it to 50 mg daily on February 13. Since increasing the dose or wheezing and shortness of breath have improved. However she feels that she has not returned to her baseline.   05/13/12 ROV -- Loraine has been coughing since her  last visit. She's had increased cough, sputum production with light green color but no blood. The chest pain from the rib fracture has improved but she continues to have shortness of breath and increased sputum production from baseline. She continues to use and benefit from her oxygen at 3 L a minute. She has not decreased her dose of Lasix below 20 mg since her last visit. She was seen by her primary care physician recently who made some recommendations for treating the pain. She has not taken any antibiotics lately.  07/15/2012 ROV -- breathing, worse last week needed prednisone bump, still giving out even with minimal  activity like curling hair spiriva feels like it helps, but nebs not s   Past Medical History  Diagnosis Date  . COPD (chronic obstructive pulmonary disease)   . Hypertension   . Hyperlipidemia      Review of Systems  HENT: Negative for congestion, rhinorrhea, sneezing and postnasal drip.   Respiratory: Positive for cough and shortness of breath. Negative for chest tightness and wheezing.   Cardiovascular: Negative for chest pain and leg swelling.       Objective:   Physical Exam   Filed Vitals:   07/15/12 0900  BP: 144/92  Pulse: 90  Temp: 97.9 F (36.6 C)  TempSrc: Oral  Height: 5' 3.5" (1.613 m)  Weight: 157 lb (71.215 kg)  SpO2: 93%   3 L nasal cannula  Gen: chronically ill appearing, cushingoid appearance HEENT: NCAT, EOMi, OP clear,  buffalo hump noted PULM: Few crackles in bases, o w normal percussion CV: RRR, slight systolic murmur, no JVD AB: BS+, soft, nontender, no hsm Ext: warm, trace edema, no clubbing, no cyanosis Derm: thin skin, multiple bruises  2009 PFT's with severe obstruction (32% predicted) and DLCO of 40% predicted  09/16/2013 EKG QTc interval 422  11/05/2011 walked 75 feet in the office and her oxygen level dropped to 87%.  01/2012 CXR >> no infiltrate or masses    Assessment & Plan:   CHRONIC OBSTRUCTIVE PULMONARY DISEASE, SEVERE Shaletha has very severe COPD and could qualify for hospice at pretty much any point.  They are not ready to consider this however.  Her prednisone dose is too high and causing more harm than good.  She continues to have repeated flares of COPD.  Plan: -try slow prednisone wean -continue monthly doxycycline -apply for patient assistance for roflumilast -try switching perforomist/pulmicort to symbicort for a week -continue Spiriva -continue oxygen  Steroid dependence We discussed this again today. I explained to her that this dose of prednisone really makes no sense and has caused her to experience most  known adverse reactions to the drug.  She is psychologically and physiologically dependent on the prednisone and the repeated flares of COPD in the last year have shown [place] that there is no real benefit from this high of a dose.  Plan: -gradually taper down to 15mg  daily,  -start roflumilast again with patient assistance, hopefully will be able to drop pred dose lower    Updated Medication List Outpatient Encounter Prescriptions as of 07/15/2012  Medication Sig Dispense Refill  . albuterol (PROAIR HFA) 108 (90 BASE) MCG/ACT inhaler Inhale 2 puffs into the lungs every 6 (six) hours as needed.  8.5 g  5  . albuterol (PROVENTIL) (2.5 MG/3ML) 0.083% nebulizer solution Take 3 mLs (2.5 mg total) by nebulization every 6 (six) hours as needed for wheezing.  75 mL  12  . alendronate (FOSAMAX) 70 MG tablet Take 70  mg by mouth every 7 (seven) days. Take with a full glass of water on an empty stomach.      Marland Kitchen atorvastatin (LIPITOR) 10 MG tablet Take 10 mg by mouth daily.      . budesonide (PULMICORT) 0.25 MG/2ML nebulizer solution Take 2 mLs (0.25 mg total) by nebulization 2 (two) times daily.  60 mL  2  . ELIDEL 1 % cream Apply 1 application topically 2 (two) times daily.      . formoterol (PERFOROMIST) 20 MCG/2ML nebulizer solution Take 2 mLs (20 mcg total) by nebulization 2 (two) times daily.  120 mL  6  . naproxen sodium (ANAPROX) 220 MG tablet Take 220 mg by mouth 2 (two) times daily as needed.      Marland Kitchen olmesartan-hydrochlorothiazide (BENICAR HCT) 40-25 MG per tablet Take 1 tablet by mouth daily.      . potassium chloride (K-DUR) 10 MEQ tablet Take 10 mEq by mouth daily.      . predniSONE (DELTASONE) 20 MG tablet Take 1 tablet (20 mg total) by mouth daily.  30 tablet  3  . tiotropium (SPIRIVA) 18 MCG inhalation capsule Place 1 capsule (18 mcg total) into inhaler and inhale daily.  30 capsule  5  . verapamil (VERELAN PM) 240 MG 24 hr capsule Take 240 mg by mouth at bedtime.      . [DISCONTINUED] predniSONE  (DELTASONE) 10 MG tablet Take 4 tabs x 2 days, 3 tabs x 2 days, 2 tabs x 2 days, 1 tab x 2 days then stop  20 tablet  0   No facility-administered encounter medications on file as of 07/15/2012.

## 2012-07-15 NOTE — Assessment & Plan Note (Signed)
Tina Castro has very severe COPD and could qualify for hospice at pretty much any point.  They are not ready to consider this however.  Her prednisone dose is too high and causing more harm than good.  She continues to have repeated flares of COPD.  Plan: -try slow prednisone wean -continue monthly doxycycline -apply for patient assistance for roflumilast -try switching perforomist/pulmicort to symbicort for a week -continue Spiriva -continue oxygen

## 2012-07-28 ENCOUNTER — Telehealth: Payer: Self-pay | Admitting: Pulmonary Disease

## 2012-07-28 MED ORDER — BUDESONIDE-FORMOTEROL FUMARATE 160-4.5 MCG/ACT IN AERO: 2.0000 | INHALATION_SPRAY | Freq: Two times a day (BID) | RESPIRATORY_TRACT | Status: DC

## 2012-07-28 NOTE — Telephone Encounter (Signed)
Spoke with patient-- Patient states symbicort is "really helping me. I feel like I have new lungs" States she is able to get out in her yard, which prior to starting this she was unable to do Patient is requesting rx be sent to [facility] Patient states she is out of the sample-- rx for symbicort has been sent to pharmacy Will forward to Dr. Kendrick Fries so that he is aware and to be sure nothing further needs to be changed/ordered

## 2012-07-28 NOTE — Telephone Encounter (Signed)
ATC [employer] about patient assistance at [phone]; they are closed at this time and will need to call back Tuesday 07-29-12 AM.

## 2012-07-29 NOTE — Telephone Encounter (Signed)
ATC again office is closed. Will call in AM. Carron Curie, CMA

## 2012-07-30 MED ORDER — ROFLUMILAST 500 MCG PO TABS: 500.0000 ug | ORAL_TABLET | Freq: Every day | ORAL | Status: DC

## 2012-07-30 NOTE — Telephone Encounter (Signed)
Called and spoke with Joni Reining at [facility] She states that all they are needing is a prescription faxed to them with the pt's ref # Will accept stamped rxs I have faxed rx for 90 days supply to the number given  Nothing further needed

## 2012-08-12 ENCOUNTER — Telehealth: Payer: Self-pay | Admitting: Pulmonary Disease

## 2012-08-12 NOTE — Telephone Encounter (Signed)
We received pt's daliresp from [facility] pt assistance  Med left in the [place] clinic to pick up  [facility] to inform the pt

## 2012-08-15 NOTE — Telephone Encounter (Signed)
Spoke with the pt She states that she did receive the medication  Nothing further needed

## 2012-08-28 ENCOUNTER — Telehealth: Payer: Self-pay | Admitting: Pulmonary Disease

## 2012-08-28 NOTE — Telephone Encounter (Signed)
Pt wanted to know if she could starting taking Daliresp today. BQ wanted to replace prednisone with Daliresp. She has been off of prednisone for 2 days. Advised that she could start daliresp tomorrow morning. She agreed and verbalized understanding.

## 2012-08-29 ENCOUNTER — Other Ambulatory Visit: Payer: Self-pay | Admitting: *Deleted

## 2012-08-29 MED ORDER — PREDNISONE 5 MG PO TABS: 20.0000 mg | ORAL_TABLET | Freq: Every day | ORAL | Status: DC

## 2012-08-29 MED ORDER — PREDNISONE 20 MG PO TABS: 20.0000 mg | ORAL_TABLET | Freq: Every day | ORAL | Status: DC

## 2012-09-01 ENCOUNTER — Telehealth: Payer: Self-pay | Admitting: Pulmonary Disease

## 2012-09-01 NOTE — Telephone Encounter (Signed)
Spoke with patient, informed her of recs per Dr. Sherene Sires  Patient verbalized understanding and nothing further needed at this time

## 2012-09-01 NOTE — Telephone Encounter (Signed)
Best option is take one M W F x 2 weeks then one daily just to get her gut used to it but if can't tolerate that then regroup with Dr Kendrick Fries asap

## 2012-09-01 NOTE — Telephone Encounter (Signed)
Spoke with patient-- Patient started on Daliresp 08/28/12: Call Documentation    Caryl Ada, CMA at 08/28/2012 2:20 PM    Status: Signed             Pt wanted to know if she could starting taking Daliresp today.  BQ wanted to replace prednisone with Daliresp.  She has been off of prednisone for 2 days.  Advised that she could start daliresp tomorrow morning.  She agreed and verbalized understanding.  ----------------------- Patient states since starting this medication she has been nauseas and having diarrhea and feels "terrible" Says she has continued taking medication throughout having these sxs Patient requesting rec's Dr. Sherene Sires please advise, thank you  [facility]  Last OV: 07/15/12 w f/u in 6-8 weeks per AVS and not scheduled at this time  No Known Allergies

## 2012-09-02 ENCOUNTER — Telehealth: Payer: Self-pay | Admitting: Pulmonary Disease

## 2012-09-02 ENCOUNTER — Telehealth: Payer: Self-pay | Admitting: Internal Medicine

## 2012-09-02 ENCOUNTER — Emergency Department: Payer: Self-pay | Admitting: Emergency Medicine

## 2012-09-02 ENCOUNTER — Telehealth: Payer: Self-pay | Admitting: Family Medicine

## 2012-09-02 NOTE — Telephone Encounter (Signed)
Duplicate message. Ari Engelbrecht, CMA  

## 2012-09-02 NOTE — Telephone Encounter (Signed)
ATC Tina Castro back  Was transferred to her line and the phone rang for over 1 min WCB later

## 2012-09-02 NOTE — Telephone Encounter (Signed)
Per SN---  Ok to give the prednisone and pt to follow up with Dr. Kendrick Fries ASAP.  thanks

## 2012-09-02 NOTE — Telephone Encounter (Signed)
Pt is not feeling well at all she is very sick and pt's husband would like a call back as soon as possible

## 2012-09-02 NOTE — Telephone Encounter (Signed)
Spoke with Buelah Manis, NP and notified of recs per SN She verbalized understanding Will give steroid inj and oral meds and send home  Pt will keep appt tomorrow with MW as planned  Nothing further needed

## 2012-09-03 ENCOUNTER — Ambulatory Visit (INDEPENDENT_AMBULATORY_CARE_PROVIDER_SITE_OTHER): Payer: Medicare Other | Admitting: Internal Medicine

## 2012-09-03 ENCOUNTER — Encounter: Payer: Self-pay | Admitting: Internal Medicine

## 2012-09-03 VITALS — BP 120/60 | HR 66 | Temp 97.0°F | Ht 63.5 in | Wt 162.0 lb

## 2012-09-03 DIAGNOSIS — J449 Chronic obstructive pulmonary disease, unspecified: Secondary | ICD-10-CM

## 2012-09-03 NOTE — Progress Notes (Signed)
Synopsis: Ms. Tina Castro first saw [name] in [place] in early 2013 and was diagnosed with severe COPD.  She uses 2 L O2 continuosly.  Her 2009 spirometry showed severe obstruction (32% pred) and a DLCO of 40% pred.  She has been on chronic steroids for years.  We attempted a taper of prednisone in the Spring of 2013 and could not go lower than 10mg .    Subjective:    Patient ID: Tina Castro, female    DOB: [birth year], 72 y.o.   MRN: [id number]  HPI   06/11/2011 ROV -- Tina Castro states that her breathing has been better but she has been having hip pain, which is limiting her walking.  This is slowly improving with aleve.  Her cough has improved, and she is only using O2 with exertion and at sleep.  No leg swelling, no chest pain.  She has been using perforomist, but not pulmicort.  She has been using tiotropium.    09/13/11 ROV-- Ms. Tina Castro states that she has been doing fairly well.  Some days are better than others.  She thinks that the albuterol isn't quite strong enough because it doesn't last very long.  She is continuing to use her spiriva and perforomist, but it's not clear to me if she is taking the pulmicort. She states that she decreased her prednisone to 15 mg after the last visit. For the last 3 weeks she's had a cold with a cough productive of green sputum. She has not had sinus symptoms, fever, or increasing wheezing or shortness of breath. She continues to take the azithromycin daily and has not had hearing trouble while taking this.  10/18/2011 Acute office visit AE COPD, Rx prednisone and Levaquin  11/05/2011 ROV -- [name] is doing well since being given the prescription for Levaquin and he increased dose of prednisone. Her cough persists but is now only productive of scant sputum which is her baseline. She continues to itch all over which has continued for the last 3 months. Her primary care physician stopped the hydrochlorothiazide but she says that this did not help. Her shortness of  breath has improved now that she is back on 20 mg of prednisone daily. She is aware that she has a compression fracture but she has minimal pain from this.  01/15/2011 ROV -- Tina Castro returns to clinic today stating that she's been more short of breath for the last week. She has not had a fever or chills or sinus symptoms. However she has had increasing shortness of breath, wheezing, sputum production, and cough. She received a flu shot this year. She felt like she should have gone to the doctor last week but she waited until she could see [place] today. Prior to this she was doing okay, still taking prednisone 20 mg daily. She still uses and benefits from 3 L of oxygen continuously.  02/25/12 ROV -- Since our last visit she initially recovered from the cough and shortness of breath of which she complained in January 2014. However approximately one week prior to this visit she developed increasing shortness of breath. This is been associated with green sputum production and no fevers or chest pain. She's not swelling in her legs. She continues to take prednisone daily and increased it to 50 mg daily on February 13. Since increasing the dose or wheezing and shortness of breath have improved. However she feels that she has not returned to her baseline.   05/13/12 ROV -- Tina Castro has been coughing since her  last visit. She's had increased cough, sputum production with light green color but no blood. The chest pain from the rib fracture has improved but she continues to have shortness of breath and increased sputum production from baseline. She continues to use and benefit from her oxygen at 3 L a minute. She has not decreased her dose of Lasix below 20 mg since her last visit. She was seen by her primary care physician recently who made some recommendations for treating the pain. She has not taken any antibiotics lately.  07/15/2012 ROV -- breathing, worse last week needed prednisone bump, still giving out even with minimal  activity like curling hair spiriva feels like it helps, but nebs not s   09/03/2012 acute post ER eval Chief Complaint  Patient presents with  . Follow-up    Pt states was unable to tolerate daliresp due to weakness, chills and nausea. She states her breathing seems to be doing well  today and her stomach feels better.   uses 02 as needed,  On best days pushing cart can walk at " [employer]"  no 02 On day of ov very hoarse,  Has not used spiriva yet - very confused with maint vs prns  No obvious pattern in day to day or daytime variabilty or assoc excess mucus or prod cough or cp or chest tightness, subjective wheeze overt sinus or hb symptoms. No unusual exp hx or h/o childhood pna/ asthma or knowledge of premature birth.   Sleeping ok without nocturnal  or early am exacerbation  of respiratory  c/o's or need for noct saba. Also denies any obvious fluctuation of symptoms with weather or environmental changes or other aggravating or alleviating factors except as outlined above   Current Medications, Allergies, Past Medical History, Past Surgical History, Family History, and Social History were reviewed in [employer] record.  ROS  The following are not active complaints unless bolded sore throat, dysphagia, dental problems, itching, sneezing,  nasal congestion or excess/ purulent secretions, ear ache,   fever, chills, sweats, unintended wt loss, pleuritic or exertional cp, hemoptysis,  orthopnea pnd or leg swelling, presyncope, palpitations, heartburn, abdominal pain, anorexia, nausea, vomiting, diarrhea  or change in bowel or urinary habits, change in stools or urine, dysuria,hematuria,  rash, arthralgias, visual complaints, headache, numbness weakness or ataxia or problems with walking or coordination,  change in mood/affect or memory.        Past Medical History  Diagnosis Date  . COPD (chronic obstructive pulmonary disease)   . Hypertension   . Hyperlipidemia            Objective:   Physical Exam   3 L nasal cannula  Gen: chronically ill appearing, cushingoid appearance HEENT mild turbinate edema.  Oropharynx no thrush or excess pnd or cobblestoning.  No JVD or cervical adenopathy. Mild accessory muscle hypertrophy. Trachea midline, nl thryroid. Chest was hyperinflated by percussion with diminished breath sounds and moderate increased exp time without wheeze. Hoover sign positive at mid inspiration. Regular rate and rhythm without murmur gallop or rub or increase P2 or edema.  Abd: no hsm, nl excursion. Ext warm without cyanosis or clubbing.    2009 PFT's with severe obstruction (32% predicted) and DLCO of 40% predicted  09/16/2013 EKG QTc interval 422  11/05/2011 walked 75 feet in the office and her oxygen level dropped to 87%.  01/2012 CXR >> no infiltrate or masses    Assessment & Plan:

## 2012-09-03 NOTE — Patient Instructions (Addendum)
Plan A = automatic Symbicort 160 Take 2 puffs first thing in am and then another 2 puffs about 12 hours later                spiriva two drags off each capsule am                Try prilosec 20mg   Take 30-60 min before first meal of the day and Pepcid 20 mg one bedtime                  Stop fosfamax   Plan B = Backup = proaire up to 2 puffs every 4 hours if needed   Plan C = Nebulizer albuterol up to 4h if needed but only after you try plan B first  Prednisone 10 mg 2 daily until better, then 1 daily until Mc[facility] sees you   GERD (REFLUX)  is an extremely common cause of respiratory symptoms just like yours, many times with no significant heartburn at all.    It can be treated with medication, but also with lifestyle changes including avoidance of late meals, excessive alcohol, smoking cessation, and avoid fatty foods, chocolate, peppermint, colas, red wine, and acidic juices such as orange juice.  NO MINT OR MENTHOL PRODUCTS SO NO COUGH DROPS  USE SUGARLESS CANDY INSTEAD (jolley ranchers or Stover's)  NO OIL BASED VITAMINS - use powdered substitutes.

## 2012-09-03 NOTE — Telephone Encounter (Signed)
I spoke with pt and is aware. Noting further needed

## 2012-09-03 NOTE — Telephone Encounter (Signed)
Please call her back and tell her to keep taking the Daliresp MWF as per Dr. Sherene Sires However, tell her to keep taking her prednisone at 15mg  daily for now and we will try to back off on it the next time we see her.  She has been on prednisone for years and will need a slow taper.

## 2012-09-04 NOTE — Assessment & Plan Note (Signed)
Not previous floor 15 mg per day  The goal with a chronic steroid dependent illness is always arriving at the lowest effective dose that controls the disease/symptoms and not accepting a set "formula" which is based on statistics or guidelines that don't always take into account patient  variability or the natural hx of the dz in every individual patient, which may well vary over time.  For now therefore I recommend the patient maintain  A ceiling of 20 mg per day and a floor of 10 mg daily.  See instructions for specific recommendations which were reviewed directly with the patient who was given a copy with highlighter outlining the key components.

## 2012-09-04 NOTE — Assessment & Plan Note (Addendum)
DDX of  difficult airways managment all start with A and  include Adherence, Ace Inhibitors, Acid Reflux, Active Sinus Disease, Alpha 1 Antitripsin deficiency, Anxiety masquerading as Airways dz,  ABPA,  allergy(esp in young), Aspiration (esp in elderly), Adverse effects of DPI,  Active smokers, plus two Bs  = Bronchiectasis and Beta blocker use..and one C= CHF  Adherence is always the initial "prime suspect" and is a multilayered concern that requires a "trust but verify" approach in every patient - starting with knowing how to use medications, especially inhalers, correctly, keeping up with refills and understanding the fundamental difference between maintenance and prns vs those medications only taken for a very short course and then stopped and not refilled. She is struggling severely with concept of med rec and fundamentals of maint vs prns, reviewed    The proper method of use, as well as anticipated side effects, of a metered-dose inhaler are discussed and demonstrated to the patient. Improved effectiveness after extensive coaching during this visit to a level of approximately  90% with dpi  and 75% with mdi   ? Acid reflux > very high risk on fosfamax so rec try off and  rx empirically with diet x min of 4 weeks> See instructions for specific recommendations which were reviewed directly with the patient who was given a copy with highlighter outlining the key components.

## 2012-09-05 ENCOUNTER — Other Ambulatory Visit: Payer: Self-pay | Admitting: *Deleted

## 2012-09-05 MED ORDER — TIOTROPIUM BROMIDE MONOHYDRATE 18 MCG IN CAPS: 18.0000 ug | ORAL_CAPSULE | Freq: Every day | RESPIRATORY_TRACT | Status: DC

## 2012-09-22 ENCOUNTER — Other Ambulatory Visit: Payer: Self-pay | Admitting: *Deleted

## 2012-09-22 MED ORDER — TIOTROPIUM BROMIDE MONOHYDRATE 18 MCG IN CAPS: 18.0000 ug | ORAL_CAPSULE | Freq: Every day | RESPIRATORY_TRACT | Status: DC

## 2012-09-30 ENCOUNTER — Ambulatory Visit: Payer: Medicare Other | Admitting: Pulmonary Disease

## 2012-10-07 ENCOUNTER — Ambulatory Visit: Payer: Medicare Other | Admitting: Pulmonary Disease

## 2012-10-28 ENCOUNTER — Ambulatory Visit: Payer: Medicare Other | Admitting: Pulmonary Disease

## 2012-10-30 ENCOUNTER — Other Ambulatory Visit: Payer: Self-pay | Admitting: Pulmonary Disease

## 2012-10-30 MED ORDER — TIOTROPIUM BROMIDE MONOHYDRATE 18 MCG IN CAPS: 18.0000 ug | ORAL_CAPSULE | Freq: Every day | RESPIRATORY_TRACT | Status: DC

## 2012-11-04 ENCOUNTER — Ambulatory Visit (INDEPENDENT_AMBULATORY_CARE_PROVIDER_SITE_OTHER): Payer: Medicare Other | Admitting: Pulmonary Disease

## 2012-11-04 ENCOUNTER — Encounter: Payer: Self-pay | Admitting: Pulmonary Disease

## 2012-11-04 VITALS — BP 140/80 | HR 92 | Temp 97.8°F | Ht 63.5 in | Wt 163.8 lb

## 2012-11-04 DIAGNOSIS — J9611 Chronic respiratory failure with hypoxia: Secondary | ICD-10-CM

## 2012-11-04 DIAGNOSIS — J9610 Chronic respiratory failure, unspecified whether with hypoxia or hypercapnia: Secondary | ICD-10-CM

## 2012-11-04 DIAGNOSIS — J449 Chronic obstructive pulmonary disease, unspecified: Secondary | ICD-10-CM

## 2012-11-04 MED ORDER — OMEPRAZOLE 20 MG PO CPDR: 20.0000 mg | DELAYED_RELEASE_CAPSULE | Freq: Two times a day (BID) | ORAL | Status: DC

## 2012-11-04 MED ORDER — BUDESONIDE-FORMOTEROL FUMARATE 80-4.5 MCG/ACT IN AERO: 2.0000 | INHALATION_SPRAY | Freq: Two times a day (BID) | RESPIRATORY_TRACT | Status: DC

## 2012-11-04 MED ORDER — ALPRAZOLAM 0.25 MG PO TABS: 0.2500 mg | ORAL_TABLET | Freq: Every evening | ORAL | Status: DC | PRN

## 2012-11-04 NOTE — Assessment & Plan Note (Signed)
Instructed to use oxygen with exertion and sleep, OK to take it off when at rest

## 2012-11-04 NOTE — Progress Notes (Signed)
Synopsis: [name] first saw [name] in [place] in early 2013 and was diagnosed with severe COPD.  She uses 2 L O2 continuosly.  Her 2009 spirometry showed severe obstruction (32% pred) and a DLCO of 40% pred.  She has been on chronic steroids for years.  We attempted a taper of prednisone in the Spring of 2013 and could not go lower than 10mg .    Subjective:    Patient ID: Tina Castro, female    DOB: [birth year], 72 y.o.   MRN: [id number]  HPI   06/11/2011 ROV -- Tina Castro states that her breathing has been better but she has been having hip pain, which is limiting her walking.  This is slowly improving with aleve.  Her cough has improved, and she is only using O2 with exertion and at sleep.  No leg swelling, no chest pain.  She has been using perforomist, but not pulmicort.  She has been using tiotropium.    09/13/11 ROV-- Tina Castro states that she has been doing fairly well.  Some days are better than others.  She thinks that the albuterol isn't quite strong enough because it doesn't last very long.  She is continuing to use her spiriva and perforomist, but it's not clear to me if she is taking the pulmicort. She states that she decreased her prednisone to 15 mg after the last visit. For the last 3 weeks she's had a cold with a cough productive of green sputum. She has not had sinus symptoms, fever, or increasing wheezing or shortness of breath. She continues to take the azithromycin daily and has not had hearing trouble while taking this.  10/18/2011 Acute office visit AE COPD, Rx prednisone and Levaquin  11/05/2011 ROV -- Tina Castro is doing well since being given the prescription for Levaquin and he increased dose of prednisone. Her cough persists but is now only productive of scant sputum which is her baseline. She continues to itch all over which has continued for the last 3 months. Her primary care physician stopped the hydrochlorothiazide but she says that this did not help. Her shortness of  breath has improved now that she is back on 20 mg of prednisone daily. She is aware that she has a compression fracture but she has minimal pain from this.  01/15/2011 ROV -- Tina Castro returns to clinic today stating that she's been more short of breath for the last week. She has not had a fever or chills or sinus symptoms. However she has had increasing shortness of breath, wheezing, sputum production, and cough. She received a flu shot this year. She felt like she should have gone to the doctor last week but she waited until she could see [place] today. Prior to this she was doing okay, still taking prednisone 20 mg daily. She still uses and benefits from 3 L of oxygen continuously.  02/25/12 ROV -- Since our last visit she initially recovered from the cough and shortness of breath of which she complained in January 2014. However approximately one week prior to this visit she developed increasing shortness of breath. This is been associated with green sputum production and no fevers or chest pain. She's not swelling in her legs. She continues to take prednisone daily and increased it to 50 mg daily on February 13. Since increasing the dose or wheezing and shortness of breath have improved. However she feels that she has not returned to her baseline.   05/13/12 ROV -- Tina Castro has been coughing since her  last visit. She's had increased cough, sputum production with light green color but no blood. The chest pain from the rib fracture has improved but she continues to have shortness of breath and increased sputum production from baseline. She continues to use and benefit from her oxygen at 3 L a minute. She has not decreased her dose of Lasix below 20 mg since her last visit. She was seen by her primary care physician recently who made some recommendations for treating the pain. She has not taken any antibiotics lately.  07/15/2012 ROV -- AE COPD, Rx prednisone  09/03/2012 ER follow up> saw [facility], Rx ant-acids, stop fosamax,  change nebulizers to Symbicort  11/03/2012 ROV > She had another exacerbation of COPD since the last visit which required an ER.  She has not taken albuterol since then. She has been on two ant-acids which have helped a lot with the cough.  She wonders if the nebulizer was making her cough more.  She is now using her oxygen more as home on an as needed basis when she is more busy. She is really only using the oxygen at home, but not when she is out and about. She is supposed to have cataract surgery and needs to be able to lay flat during that time.    Past Medical History  Diagnosis Date  . COPD (chronic obstructive pulmonary disease)   . Hypertension   . Hyperlipidemia      Review of Systems  HENT: Negative for congestion, postnasal drip, rhinorrhea and sneezing.   Respiratory: Positive for cough and shortness of breath. Negative for chest tightness and wheezing.   Cardiovascular: Negative for chest pain and leg swelling.       Objective:   Physical Exam   Filed Vitals:   11/04/12 0957  BP: 140/80  Pulse: 92  Temp: 97.8 F (36.6 C)  TempSrc: Oral  Height: 5' 3.5" (1.613 m)  Weight: 163 lb 12.8 oz (74.299 kg)  SpO2: 94%  RA  Gen: chronically ill appearing, cushingoid appearance HEENT: NCAT, EOMi, OP clear,  buffalo hump noted PULM: Few crackles in bases, o w normal percussion CV: RRR, slight systolic murmur, no JVD AB: BS+, soft, nontender, no hsm Ext: warm, trace edema, no clubbing, no cyanosis Derm: thin skin, multiple bruises  2009 PFT's with severe obstruction (32% predicted) and DLCO of 40% predicted  09/16/2013 EKG QTc interval 422  11/05/2011 walked 75 feet in the office and her oxygen level dropped to 87%.  01/2012 CXR >> no infiltrate or masses    Assessment & Plan:   Chronic hypoxemic respiratory failure Instructed to use oxygen with exertion and sleep, OK to take it off when at rest  CHRONIC OBSTRUCTIVE PULMONARY DISEASE, SEVERE She has been a  very difficult case to treat.  She has failed daily azithromycin, daily roflumilast and has recurrent flares of COPD.  At this point she seems to be doing well with Symbicort and better Acid reflux control.  She really doesn't need to be on this high of a dose of symbicort.  In regards to cataract surgery, there is nothing to add in terms of medicines.  I think that some of the problem with lying flat is anxiety.  Plan: -flu shot UTD -she will check with her PCP to see when she last had the pneumovax -check O2 saturation today -use Xanax to help with anxiety when lying flat (take at home qHS with husband there), one tablet prescribed    Updated Medication  List Outpatient Encounter Prescriptions as of 11/04/2012  Medication Sig Dispense Refill  . albuterol (PROAIR HFA) 108 (90 BASE) MCG/ACT inhaler Inhale 2 puffs into the lungs every 6 (six) hours as needed.  8.5 g  5  . albuterol (PROVENTIL) (2.5 MG/3ML) 0.083% nebulizer solution Take 3 mLs (2.5 mg total) by nebulization every 6 (six) hours as needed for wheezing.  75 mL  12  . atorvastatin (LIPITOR) 10 MG tablet Take 10 mg by mouth daily.      . budesonide-formoterol (SYMBICORT) 160-4.5 MCG/ACT inhaler Inhale 2 puffs into the lungs 2 (two) times daily.  1 Inhaler  0  . ELIDEL 1 % cream Apply 1 application topically 2 (two) times daily.      . famotidine (PEPCID) 20 MG tablet Take 20 mg by mouth at bedtime.      . naproxen sodium (ANAPROX) 220 MG tablet Take 220 mg by mouth 2 (two) times daily as needed.      Marland Kitchen olmesartan-hydrochlorothiazide (BENICAR HCT) 40-25 MG per tablet Take 1 tablet by mouth daily.      Marland Kitchen omeprazole (PRILOSEC) 20 MG capsule Take 20 mg by mouth daily before breakfast.      . predniSONE (DELTASONE) 10 MG tablet Take 10 mg by mouth daily.       Marland Kitchen Spacer/Aero-Holding Chambers (AEROCHAMBER MINI CHAMBER) DEVI Inhale 2 puffs into the lungs 2 (two) times daily.  1 Device  0  . tiotropium (SPIRIVA) 18 MCG inhalation capsule  Place 1 capsule (18 mcg total) into inhaler and inhale daily.  30 capsule  5  . verapamil (VERELAN PM) 240 MG 24 hr capsule Take 240 mg by mouth at bedtime.      . [DISCONTINUED] potassium chloride (K-DUR) 10 MEQ tablet Take 10 mEq by mouth daily.       No facility-administered encounter medications on file as of 11/04/2012.

## 2012-11-04 NOTE — Patient Instructions (Addendum)
Continue taking the prednisone as you are doing Decrease the dose of the Symbicort to 80/4.5 (finish what you have then refill the next inhaler with this dose) Use the Xanax to help you lay flat at night so you can have surgery  Use your oxygen when you exert yourself  We will see you back in 3 months or sooner if needed

## 2012-11-04 NOTE — Assessment & Plan Note (Signed)
She has been a very difficult case to treat.  She has failed daily azithromycin, daily roflumilast and has recurrent flares of COPD.  At this point she seems to be doing well with Symbicort and better Acid reflux control.  She really doesn't need to be on this high of a dose of symbicort.  In regards to cataract surgery, there is nothing to add in terms of medicines.  I think that some of the problem with lying flat is anxiety.  Plan: -flu shot UTD -she will check with her PCP to see when she last had the pneumovax -check O2 saturation today -use Xanax to help with anxiety when lying flat (take at home qHS with husband there), one tablet prescribed

## 2012-11-06 ENCOUNTER — Telehealth: Payer: Self-pay | Admitting: Pulmonary Disease

## 2012-11-06 NOTE — Telephone Encounter (Signed)
Spoke with pt and instructed that Xanax tablet is to be taken the night prior to having cataract surgery to help her be able to lie flat during surgery.  Pt verbalized understanding.

## 2012-11-26 ENCOUNTER — Inpatient Hospital Stay: Payer: Self-pay | Admitting: Internal Medicine

## 2012-12-17 ENCOUNTER — Inpatient Hospital Stay: Payer: Self-pay | Admitting: Internal Medicine

## 2013-01-02 ENCOUNTER — Ambulatory Visit: Payer: Self-pay | Admitting: Family Medicine

## 2013-01-07 ENCOUNTER — Other Ambulatory Visit: Payer: Self-pay | Admitting: *Deleted

## 2013-01-07 MED ORDER — OMEPRAZOLE 20 MG PO CPDR: 20.0000 mg | DELAYED_RELEASE_CAPSULE | Freq: Two times a day (BID) | ORAL | Status: DC

## 2013-01-27 ENCOUNTER — Inpatient Hospital Stay: Payer: Self-pay | Admitting: Internal Medicine

## 2013-02-05 ENCOUNTER — Ambulatory Visit: Payer: Self-pay | Admitting: Family Medicine

## 2013-02-13 ENCOUNTER — Ambulatory Visit: Payer: Self-pay | Admitting: Family Medicine

## 2013-03-04 ENCOUNTER — Other Ambulatory Visit: Payer: Self-pay

## 2013-03-04 MED ORDER — OMEPRAZOLE 20 MG PO CPDR: 20.0000 mg | DELAYED_RELEASE_CAPSULE | Freq: Two times a day (BID) | ORAL | Status: DC

## 2013-03-08 ENCOUNTER — Ambulatory Visit: Payer: Self-pay | Admitting: Internal Medicine

## 2013-03-09 ENCOUNTER — Other Ambulatory Visit: Payer: Self-pay

## 2013-03-09 MED ORDER — OMEPRAZOLE 20 MG PO CPDR: 20.0000 mg | DELAYED_RELEASE_CAPSULE | Freq: Two times a day (BID) | ORAL | Status: DC

## 2013-03-10 ENCOUNTER — Ambulatory Visit: Payer: Medicare Other | Admitting: Pulmonary Disease

## 2013-03-11 ENCOUNTER — Ambulatory Visit (INDEPENDENT_AMBULATORY_CARE_PROVIDER_SITE_OTHER): Payer: Medicare Other | Admitting: Pulmonary Disease

## 2013-03-11 ENCOUNTER — Ambulatory Visit: Payer: Self-pay | Admitting: Family Medicine

## 2013-03-11 VITALS — BP 142/66 | HR 84 | Temp 98.8°F | Ht 63.5 in | Wt 154.0 lb

## 2013-03-11 DIAGNOSIS — J449 Chronic obstructive pulmonary disease, unspecified: Secondary | ICD-10-CM

## 2013-03-11 DIAGNOSIS — Z7189 Other specified counseling: Secondary | ICD-10-CM

## 2013-03-11 DIAGNOSIS — J441 Chronic obstructive pulmonary disease with (acute) exacerbation: Secondary | ICD-10-CM

## 2013-03-11 DIAGNOSIS — M7989 Other specified soft tissue disorders: Secondary | ICD-10-CM

## 2013-03-11 MED ORDER — LEVOFLOXACIN 750 MG PO TABS: 750.0000 mg | ORAL_TABLET | Freq: Every day | ORAL | Status: AC

## 2013-03-11 MED ORDER — PREDNISONE 10 MG PO TABS: ORAL_TABLET | ORAL | Status: DC

## 2013-03-11 NOTE — Assessment & Plan Note (Signed)
[name] is clearly not doing well. She had very severe airflow obstruction 6 years ago and her disease has clearly progressed since then. She has really struggled with pneumonia in the last several months. I am afraid she may have it again today.  Despite my best efforts we have never been able to convince her to come off of prednisone and in fact recently her baseline prednisone dose was increased.  Overall, her condition is end-stage. I brought up the concept of hospice today and she and her husband nearly revolted at this idea. She did tell me that she wishes to never go on a ventilator. But at this point she fails to recognize that her disease has progressed over the last several years.  Plan: -Today she is having it least another exacerbation of COPD if not pneumonia. She really doesn't want to go to the hospital so we'll try to increase her prednisone and start Levaquin while getting a chest x-ray. However, she understands that she is to go to the hospital if she does not improve in 24 hours.

## 2013-03-11 NOTE — Progress Notes (Signed)
Synopsis: [name] first saw [name] in [place] in early 2013 and was diagnosed with severe COPD.  She uses 2 L O2 continuosly.  Her 2009 spirometry showed severe obstruction (32% pred) and a DLCO of 40% pred.  She has been on chronic steroids for years.  We attempted a taper of prednisone in the Spring of 2013 and could not go lower than 10mg .    Subjective:    Patient ID: Tina Castro, female    DOB: [birth year], 73 y.o.   MRN: [id number]  HPI   06/11/2011 ROV -- Tina Castro states that her breathing has been better but she has been having hip pain, which is limiting her walking.  This is slowly improving with aleve.  Her cough has improved, and she is only using O2 with exertion and at sleep.  No leg swelling, no chest pain.  She has been using perforomist, but not pulmicort.  She has been using tiotropium.    09/13/11 ROV-- Tina Castro states that she has been doing fairly well.  Some days are better than others.  She thinks that the albuterol isn't quite strong enough because it doesn't last very long.  She is continuing to use her spiriva and perforomist, but it's not clear to me if she is taking the pulmicort. She states that she decreased her prednisone to 15 mg after the last visit. For the last 3 weeks she's had a cold with a cough productive of green sputum. She has not had sinus symptoms, fever, or increasing wheezing or shortness of breath. She continues to take the azithromycin daily and has not had hearing trouble while taking this.  10/18/2011 Acute office visit AE COPD, Rx prednisone and Levaquin  11/05/2011 ROV -- Tina Castro is doing well since being given the prescription for Levaquin and he increased dose of prednisone. Her cough persists but is now only productive of scant sputum which is her baseline. She continues to itch all over which has continued for the last 3 months. Her primary care physician stopped the hydrochlorothiazide but she says that this did not help. Her shortness of  breath has improved now that she is back on 20 mg of prednisone daily. She is aware that she has a compression fracture but she has minimal pain from this.  01/15/2011 ROV -- Tina Castro returns to clinic today stating that she's been more short of breath for the last week. She has not had a fever or chills or sinus symptoms. However she has had increasing shortness of breath, wheezing, sputum production, and cough. She received a flu shot this year. She felt like she should have gone to the doctor last week but she waited until she could see [place] today. Prior to this she was doing okay, still taking prednisone 20 mg daily. She still uses and benefits from 3 L of oxygen continuously.  02/25/12 ROV -- Since our last visit she initially recovered from the cough and shortness of breath of which she complained in January 2014. However approximately one week prior to this visit she developed increasing shortness of breath. This is been associated with green sputum production and no fevers or chest pain. She's not swelling in her legs. She continues to take prednisone daily and increased it to 50 mg daily on February 13. Since increasing the dose or wheezing and shortness of breath have improved. However she feels that she has not returned to her baseline.   05/13/12 ROV -- Tina Castro has been coughing since her  last visit. She's had increased cough, sputum production with light green color but no blood. The chest pain from the rib fracture has improved but she continues to have shortness of breath and increased sputum production from baseline. She continues to use and benefit from her oxygen at 3 L a minute. She has not decreased her dose of Lasix below 20 mg since her last visit. She was seen by her primary care physician recently who made some recommendations for treating the pain. She has not taken any antibiotics lately.  07/15/2012 ROV -- AE COPD, Rx prednisone  09/03/2012 ER follow up> saw [facility], Rx ant-acids, stop fosamax,  change nebulizers to Symbicort  11/03/2012 ROV > She had another exacerbation of COPD since the last visit which required an ER.  She has not taken albuterol since then. She has been on two ant-acids which have helped a lot with the cough.  She wonders if the nebulizer was making her cough more.  She is now using her oxygen more as home on an as needed basis when she is more busy. She is really only using the oxygen at home, but not when she is out and about. She is supposed to have cataract surgery and needs to be able to lay flat during that time.    03/11/2013 ROV> Tina Castro has been more short of breath in the last week or so.  She has more mucus production and has definitely worsened in the last few days.  The sputum is mostly clear, no fever or chills.  She has not adjusted her medicines.  She has incrased her oxygen to 4L [place].  Tina Castro has been admitted to the hospital four times since the last visit. She feels like she may need to go to the hospital and the next day or 2 if she does not start to improve. She is currently taking prednisone 30 mg a day. She's been using her oxygen all the time now rather than just as needed as before.  Past Medical History  Diagnosis Date  . COPD (chronic obstructive pulmonary disease)   . Hypertension   . Hyperlipidemia      Review of Systems  HENT: Negative for congestion, postnasal drip, rhinorrhea and sneezing.   Respiratory: Positive for cough and shortness of breath. Negative for chest tightness and wheezing.   Cardiovascular: Negative for chest pain and leg swelling.       Objective:   Physical Exam   Filed Vitals:   03/11/13 1122  BP: 142/66  Pulse: 84  Temp: 98.8 F (37.1 C)  TempSrc: Oral  Height: 5' 3.5" (1.613 m)  Weight: 154 lb (69.854 kg)  SpO2: 91%  4L [place]  Gen: chronically ill appearing, cushingoid appearance HEENT: NCAT, EOMi, OP clear,  buffalo hump noted PULM: Rhonchi throughout bilaterally, some wheezing, some crackles in  bases CV: RRR, slight systolic murmur, no JVD AB: BS+, soft, nontender, no hsm Ext: warm, increased pitting edema, no clubbing, no cyanosis Derm: thin skin, multiple bruises  2009 PFT's with severe obstruction (32% predicted) and DLCO of 40% predicted  09/16/2013 EKG QTc interval 422  11/05/2011 walked 75 feet in the office and her oxygen level dropped to 87%.  01/2012 CXR >> no infiltrate or masses    Assessment & Plan:   CHRONIC OBSTRUCTIVE PULMONARY DISEASE, SEVERE Tina Castro is clearly not doing well. She had very severe airflow obstruction 6 years ago and her disease has clearly progressed since then. She has really struggled with pneumonia  in the last several months. I am afraid she may have it again today.  Despite my best efforts we have never been able to convince her to come off of prednisone and in fact recently her baseline prednisone dose was increased.  Overall, her condition is end-stage. I brought up the concept of hospice today and she and her husband nearly revolted at this idea. She did tell me that she wishes to never go on a ventilator. But at this point she fails to recognize that her disease has progressed over the last several years.  Plan: -Today she is having it least another exacerbation of COPD if not pneumonia. She really doesn't want to go to the hospital so we'll try to increase her prednisone and start Levaquin while getting a chest x-ray. However, she understands that she is to go to the hospital if she does not improve in 24 hours.  Goals of care, counseling/discussion Please see discussion above. She told me today that she would not want to go on a ventilator but when I discussed hospice she became quite defensive. Her insight into her illness is poor. We need to continue to try to educate her and her husband that her condition is end-stage.  Leg swelling I agree with her primary care doctor's plan for a stress test and echocardiogram later this  week.    Updated Medication List Outpatient Encounter Prescriptions as of 03/11/2013  Medication Sig  . albuterol (PROAIR HFA) 108 (90 BASE) MCG/ACT inhaler Inhale 2 puffs into the lungs every 6 (six) hours as needed.  Marland Kitchen atorvastatin (LIPITOR) 10 MG tablet Take 10 mg by mouth daily.  . budesonide-formoterol (SYMBICORT) 160-4.5 MCG/ACT inhaler Inhale 2 puffs into the lungs 2 (two) times daily.  Marland Kitchen ELIDEL 1 % cream Apply 1 application topically 2 (two) times daily.  . famotidine (PEPCID) 20 MG tablet Take 20 mg by mouth at bedtime.  Marland Kitchen glipiZIDE (GLUCOTROL XL) 2.5 MG 24 hr tablet Take 2.5 mg by mouth daily as needed.  . metoprolol tartrate (LOPRESSOR) 25 MG tablet Take 25 mg by mouth 2 (two) times daily.  . naproxen sodium (ANAPROX) 220 MG tablet Take 220 mg by mouth 2 (two) times daily as needed.  Marland Kitchen olmesartan-hydrochlorothiazide (BENICAR HCT) 40-25 MG per tablet Take 1 tablet by mouth daily.  Marland Kitchen omeprazole (PRILOSEC) 20 MG capsule Take 1 capsule (20 mg total) by mouth 2 (two) times daily.  . predniSONE (DELTASONE) 10 MG tablet Take 30 mg by mouth daily.   Marland Kitchen Spacer/Aero-Holding Chambers (AEROCHAMBER MINI CHAMBER) DEVI Inhale 2 puffs into the lungs 2 (two) times daily.  Marland Kitchen tiotropium (SPIRIVA) 18 MCG inhalation capsule Place 1 capsule (18 mcg total) into inhaler and inhale daily.  . verapamil (VERELAN PM) 240 MG 24 hr capsule Take 240 mg by mouth at bedtime.  . [DISCONTINUED] budesonide-formoterol (SYMBICORT) 80-4.5 MCG/ACT inhaler Inhale 2 puffs into the lungs 2 (two) times daily.  Marland Kitchen albuterol (PROVENTIL) (2.5 MG/3ML) 0.083% nebulizer solution Take 3 mLs (2.5 mg total) by nebulization every 6 (six) hours as needed for wheezing.  . [DISCONTINUED] ALPRAZolam (XANAX) 0.25 MG tablet Take 1 tablet (0.25 mg total) by mouth at bedtime as needed for sleep.

## 2013-03-11 NOTE — Patient Instructions (Signed)
If you get worse go to the hospital  Take prednisone 60mg  daily for 4 days, then 40mg  daily for 4 days, then back to baseline dose Take the levaquin for 7 days Use the flutter valve as you are doing Use albuterol (either inhaler or nebulizer) every four hours as needed We will set up a Chest [facility] at [facility] for today  We will see you back in 4-6 weeks or sooner if needed

## 2013-03-11 NOTE — Assessment & Plan Note (Signed)
Please see discussion above. She told me today that she would not want to go on a ventilator but when I discussed hospice she became quite defensive. Her insight into her illness is poor. We need to continue to try to educate her and her husband that her condition is end-stage.

## 2013-03-11 NOTE — Assessment & Plan Note (Signed)
I agree with her primary care doctor's plan for a stress test and echocardiogram later this week.

## 2013-03-12 ENCOUNTER — Telehealth: Payer: Self-pay | Admitting: Pulmonary Disease

## 2013-03-12 NOTE — Telephone Encounter (Signed)
Spoke with pt. She had her CX[facility] done at [facility]. Advised her that those reports get faxed to us so some times Dr. Kendrick FriesMcQuaid doesn't get them right away. She is aware that someone will call her once he looks at the results. Nothing further is needed.

## 2013-03-13 ENCOUNTER — Telehealth: Payer: Self-pay | Admitting: Pulmonary Disease

## 2013-03-13 NOTE — Telephone Encounter (Signed)
I spoke with the pt spouse and he states that the pt had CXR on 03-11-13. He states they saw the pt PCP today and he advised the cxr showed pna and that she needed to f/u with Dr. Kendrick FriesMcquaid. I called [facility] and asked that CXR be faxed to triage fax so I can give to BQ to review while he is here in the office. Results received and given to [facility] to give top B[facility]. Please advise. Carron CurieJennifer Castillo, CMA Allergies  Allergen Reactions  . Daliresp [Roflumilast]     GI upset

## 2013-03-13 NOTE — Telephone Encounter (Signed)
Spoke with pt, she is aware that cxr shows possible pna.  She is already on levaquin from her appt on 03/11/2013.  I spoke with her and she stated that she is already feeling better since her last visit.  I advised her to continue the abx as directed until completion.  Nothing further needed.

## 2013-03-13 NOTE — Telephone Encounter (Signed)
Pt has received the cxr results from their family dr. The family dr told them to call us to be seen today. Because she is developing pna. Pt is aware that she has pna, and the meds that BQ gave her seem to be working. What should she do

## 2013-03-19 ENCOUNTER — Telehealth: Payer: Self-pay | Admitting: Pulmonary Disease

## 2013-03-19 MED ORDER — DOXYCYCLINE HYCLATE 100 MG PO TABS: ORAL_TABLET | ORAL | Status: DC

## 2013-03-19 NOTE — Telephone Encounter (Signed)
Continue prednisone as directed  Add doxycycline 100 mg, # 8, 2 today then one daily

## 2013-03-19 NOTE — Telephone Encounter (Signed)
Per OV 03/11/13 w/ BQ Patient Instructions      If you get worse go to the hospital Take prednisone 60mg  daily for 4 days, then 40mg  daily for 4 days, then back to baseline dose Take the levaquin for 7 days Use the flutter valve as you are doing Use albuterol (either inhaler or nebulizer) every four hours as needed We will set up a Chest [facility] for today We will see you back in 4-6 weeks or sooner if needed  ---  Spoke with spouse. He reports pt is coughing up light green phlem, chest congestion. No chest tx, no wheezing, no f/c/s/n/v. She will start pred 30 mg daily. Not taking any cough syrup and has finished ABX. requesting recs. Please advise Dr. Maple HudsonYoung as BQ not in office. thanks   Allergies  Allergen Reactions  . Daliresp [Roflumilast]     GI upset     Current Outpatient Prescriptions on File Prior to Visit  Medication Sig Dispense Refill  . albuterol (PROAIR HFA) 108 (90 BASE) MCG/ACT inhaler Inhale 2 puffs into the lungs every 6 (six) hours as needed.  8.5 g  5  . albuterol (PROVENTIL) (2.5 MG/3ML) 0.083% nebulizer solution Take 3 mLs (2.5 mg total) by nebulization every 6 (six) hours as needed for wheezing.  75 mL  12  . atorvastatin (LIPITOR) 10 MG tablet Take 10 mg by mouth daily.      . budesonide-formoterol (SYMBICORT) 160-4.5 MCG/ACT inhaler Inhale 2 puffs into the lungs 2 (two) times daily.      Marland Kitchen. ELIDEL 1 % cream Apply 1 application topically 2 (two) times daily.      . famotidine (PEPCID) 20 MG tablet Take 20 mg by mouth at bedtime.      Marland Kitchen. glipiZIDE (GLUCOTROL XL) 2.5 MG 24 hr tablet Take 2.5 mg by mouth daily as needed.      Marland Kitchen. levofloxacin (LEVAQUIN) 750 MG tablet Take 1 tablet (750 mg total) by mouth daily.  7 tablet  0  . metoprolol tartrate (LOPRESSOR) 25 MG tablet Take 25 mg by mouth 2 (two) times daily.      . naproxen sodium (ANAPROX) 220 MG tablet Take 220 mg by mouth 2 (two) times daily as needed.      Marland Kitchen. olmesartan-hydrochlorothiazide (BENICAR HCT) 40-25  MG per tablet Take 1 tablet by mouth daily.      Marland Kitchen. omeprazole (PRILOSEC) 20 MG capsule Take 1 capsule (20 mg total) by mouth 2 (two) times daily.  60 capsule  5  . predniSONE (DELTASONE) 10 MG tablet Take 30 mg by mouth daily.       . predniSONE (DELTASONE) 10 MG tablet 60mg  daily for 4 days, then 40mg  daily for 4 days, then back to baseline dose of prednisone  40 tablet  0  . Spacer/Aero-Holding Chambers (AEROCHAMBER MINI CHAMBER) DEVI Inhale 2 puffs into the lungs 2 (two) times daily.  1 Device  0  . tiotropium (SPIRIVA) 18 MCG inhalation capsule Place 1 capsule (18 mcg total) into inhaler and inhale daily.  30 capsule  5  . verapamil (VERELAN PM) 240 MG 24 hr capsule Take 240 mg by mouth at bedtime.       No current facility-administered medications on file prior to visit.

## 2013-03-19 NOTE — Telephone Encounter (Signed)
Pt aware of recs.  Nothing further needed. 

## 2013-03-20 ENCOUNTER — Telehealth: Payer: Self-pay | Admitting: Pulmonary Disease

## 2013-03-20 MED ORDER — PREDNISONE 10 MG PO TABS
30.0000 mg | ORAL_TABLET | Freq: Every day | ORAL | Status: DC
Start: 2013-03-20 — End: 2013-05-22

## 2013-03-20 NOTE — Telephone Encounter (Signed)
Called spoke with pt. She reports she has prednisone reduced down to 30 mg daily. She reports since her SOB is worse. She wants to go back up until she gets to feeling better. Since BQ is not on scheduled please advise MW thanks  Allergies  Allergen Reactions  . Daliresp [Roflumilast]     GI upset

## 2013-03-20 NOTE — Telephone Encounter (Signed)
Pt is aware to continue taking 30mg  of Prednisone daily. ROV has been scheduled with BQ in [place] on 04/01/13.

## 2013-03-20 NOTE — Telephone Encounter (Signed)
Ok but needs ov w/in 2 weeks with all meds in hand to see McQuaid, TammyNP or me

## 2013-03-21 ENCOUNTER — Inpatient Hospital Stay: Payer: Self-pay | Admitting: Family Medicine

## 2013-03-30 ENCOUNTER — Ambulatory Visit: Payer: Self-pay | Admitting: Family Medicine

## 2013-04-01 ENCOUNTER — Ambulatory Visit: Payer: Medicare Other | Admitting: Pulmonary Disease

## 2013-04-08 ENCOUNTER — Ambulatory Visit: Payer: Self-pay | Admitting: Internal Medicine

## 2013-04-13 ENCOUNTER — Ambulatory Visit: Payer: Medicare Other | Admitting: Pulmonary Disease

## 2013-05-01 ENCOUNTER — Emergency Department: Payer: Self-pay | Admitting: Emergency Medicine

## 2013-05-01 ENCOUNTER — Ambulatory Visit: Payer: Self-pay | Admitting: Family Medicine

## 2013-05-22 ENCOUNTER — Telehealth: Payer: Self-pay | Admitting: Pulmonary Disease

## 2013-05-22 MED ORDER — PREDNISONE 10 MG PO TABS: 30.0000 mg | ORAL_TABLET | Freq: Every day | ORAL | Status: DC

## 2013-05-22 NOTE — Telephone Encounter (Signed)
Spoke with pt. She takes pred 30 mg daily. RX has been sent. Nothing further needed

## 2013-07-22 ENCOUNTER — Other Ambulatory Visit: Payer: Self-pay

## 2013-07-22 MED ORDER — PREDNISONE 10 MG PO TABS: 30.0000 mg | ORAL_TABLET | Freq: Every day | ORAL | Status: DC

## 2013-08-19 ENCOUNTER — Emergency Department: Payer: Self-pay | Admitting: Emergency Medicine

## 2013-08-31 ENCOUNTER — Encounter: Payer: Self-pay | Admitting: *Deleted

## 2013-09-02 ENCOUNTER — Ambulatory Visit: Payer: Self-pay | Admitting: Family Medicine

## 2013-09-05 ENCOUNTER — Inpatient Hospital Stay: Payer: Self-pay | Admitting: Internal Medicine

## 2013-09-08 ENCOUNTER — Ambulatory Visit: Payer: Medicare Other | Admitting: Cardiovascular Disease

## 2013-09-17 ENCOUNTER — Other Ambulatory Visit: Payer: Self-pay

## 2013-09-17 MED ORDER — TIOTROPIUM BROMIDE MONOHYDRATE 18 MCG IN CAPS: 18.0000 ug | ORAL_CAPSULE | Freq: Every day | RESPIRATORY_TRACT | Status: AC

## 2013-09-17 MED ORDER — PREDNISONE 10 MG PO TABS: 30.0000 mg | ORAL_TABLET | Freq: Every day | ORAL | Status: DC

## 2013-09-17 MED ORDER — OMEPRAZOLE 20 MG PO CPDR: 20.0000 mg | DELAYED_RELEASE_CAPSULE | Freq: Two times a day (BID) | ORAL | Status: DC

## 2013-09-25 ENCOUNTER — Ambulatory Visit (INDEPENDENT_AMBULATORY_CARE_PROVIDER_SITE_OTHER): Payer: Medicare Other | Admitting: Cardiovascular Disease

## 2013-09-25 ENCOUNTER — Encounter (INDEPENDENT_AMBULATORY_CARE_PROVIDER_SITE_OTHER): Payer: Self-pay

## 2013-09-25 ENCOUNTER — Encounter: Payer: Self-pay | Admitting: Cardiovascular Disease

## 2013-09-25 VITALS — BP 120/54 | HR 85 | Ht 63.0 in | Wt 158.0 lb

## 2013-09-25 DIAGNOSIS — R0602 Shortness of breath: Secondary | ICD-10-CM

## 2013-09-25 DIAGNOSIS — R9431 Abnormal electrocardiogram [ECG] [EKG]: Secondary | ICD-10-CM

## 2013-09-25 DIAGNOSIS — I10 Essential (primary) hypertension: Secondary | ICD-10-CM

## 2013-09-25 NOTE — Patient Instructions (Signed)
Your physician has requested that you have an echocardiogram. Echocardiography is a painless test that uses sound waves to create images of your heart. It provides your doctor with information about the size and shape of your heart and how well your heart's chambers and valves are working. This procedure takes approximately one hour. There are no restrictions for this procedure.  Your physician recommends that you schedule a follow-up appointment in:  As needed   

## 2013-09-28 ENCOUNTER — Encounter: Payer: Self-pay | Admitting: Cardiovascular Disease

## 2013-09-28 NOTE — Assessment & Plan Note (Signed)
I suspect that her dyspnea is likely due to COPD. Today's EKG is completely normal. Recent abnormal EKG seems to have motion artifact and possible lead misplacement of V1. Although she has multiple risk factors for coronary artery disease, the overall suspicion of significant cardiac ischemia is low.  I requested an echocardiogram to evaluate LV systolic function and pulmonary pressure. I don't think stress testing as needed at the present time.

## 2013-09-28 NOTE — Progress Notes (Signed)
Primary care physician: Dr. Brayton El  HPI  This is a pleasant 73 year old female who was referred by Dr. Sherryll Burger for evaluation of dyspnea and an abnormal ECG. She has known history of advanced COPD, type 2 diabetes, previous tobacco use and hyperlipidemia. She had a recent EKG which showed sinus tachycardia with septal ST changes. Thus, she was referred for cardiac evaluation. She is not aware of any previous cardiac history. She denies any chest discomfort, dizziness or syncope. She has chronic dyspnea related to advanced COPD. She had recent labs performed which showed normal BNP. Renal function was normal. CBC showed mild anemia with a hemoglobin of 10.1. She denies orthopnea, PND or lower extremity edema.  Allergies  Allergen Reactions  . Daliresp [Roflumilast]     GI upset  . Singulair [Montelukast]      Current Outpatient Prescriptions on File Prior to Visit  Medication Sig Dispense Refill  . albuterol (PROAIR HFA) 108 (90 BASE) MCG/ACT inhaler Inhale 2 puffs into the lungs every 6 (six) hours as needed.  8.5 g  5  . albuterol (PROVENTIL) (2.5 MG/3ML) 0.083% nebulizer solution Take 3 mLs (2.5 mg total) by nebulization every 6 (six) hours as needed for wheezing.  75 mL  12  . aspirin 81 MG tablet Take 81 mg by mouth daily.      Marland Kitchen atorvastatin (LIPITOR) 10 MG tablet Take 10 mg by mouth daily.      . budesonide-formoterol (SYMBICORT) 160-4.5 MCG/ACT inhaler Inhale 2 puffs into the lungs 2 (two) times daily.      Marland Kitchen ELIDEL 1 % cream Apply 1 application topically 2 (two) times daily.      . famotidine (PEPCID) 20 MG tablet Take 20 mg by mouth at bedtime.      . furosemide (LASIX) 40 MG tablet Take 40 mg by mouth 2 (two) times daily.      Marland Kitchen glipiZIDE (GLUCOTROL) 5 MG tablet Take 5 mg by mouth daily before breakfast.      . guaiFENesin (ROBITUSSIN) 100 MG/5ML liquid Take 200 mg by mouth as needed for cough.      . hydrOXYzine (ATARAX/VISTARIL) 50 MG tablet Take 50 mg by mouth every 8 (eight)  hours as needed.      . metoprolol tartrate (LOPRESSOR) 25 MG tablet Take 25 mg by mouth 2 (two) times daily.      Marland Kitchen olmesartan (BENICAR) 40 MG tablet Take 40 mg by mouth daily.      . potassium chloride (K-DUR) 10 MEQ tablet Take 10 mEq by mouth daily.      Marland Kitchen tiotropium (SPIRIVA) 18 MCG inhalation capsule Place 1 capsule (18 mcg total) into inhaler and inhale daily.  90 capsule  2  . verapamil (VERELAN PM) 240 MG 24 hr capsule Take 240 mg by mouth at bedtime.       No current facility-administered medications on file prior to visit.     Past Medical History  Diagnosis Date  . COPD (chronic obstructive pulmonary disease)   . Hypertension   . Hyperlipidemia   . Closed fracture of unspecified bone   . Osteoporosis, unspecified   . Candidiasis of vulva and vagina   . Obstructive chronic bronchitis with exacerbation   . Other nonspecific findings on examination of blood(790.99)   . Esophageal reflux   . Unspecified vitamin D deficiency   . Other and unspecified mycoses   . Other symptoms involving respiratory system and chest   . Hypopotassemia   . Unspecified pruritic disorder   .  Cellulitis and abscess of foot, except toes   . Other abnormal glucose   . Unspecified keratitis   . Diabetes mellitus without complication      Past Surgical History  Procedure Laterality Date  . Back surgery  1970  . Tubal ligation  1970s  . Appendectomy    . Eye surgery      left  . Spinal fusion    . Urethrotomy       Family History  Problem Relation Age of Onset  . Emphysema Daughter   . Asthma Other     grandson  . Heart disease Mother   . Clotting disorder Mother   . Hypertension Mother   . Heart attack Mother   . Heart disease Father   . Heart attack Father      History   Social History  . Marital Status: Married    Spouse Name: N/A    Number of Children: Y  . Years of Education: N/A   Occupational History  . retired from [employer]    Social History Main Topics  .  Smoking status: Former Smoker -- 1.00 packs/day for 20 years    Types: Cigarettes    Quit date: 01/08/1981  . Smokeless tobacco: Current User    Types: Snuff, Chew  . Alcohol Use: No  . Drug Use: No  . Sexual Activity: Not on file   Other Topics Concern  . Not on file   Social History Narrative  . No narrative on file     ROS A 10 point review of system was performed. It is negative other than that mentioned in the history of present illness.   PHYSICAL EXAM   BP 120/54  Pulse 85  Ht  (1.6 m)  Wt 158 lb (71.668 kg)  BMI 28.00 kg/m2 Constitutional: She is oriented to person, place, and time. She appears well-developed and well-nourished. No distress.  HENT: No nasal discharge.  Head: Normocephalic and atraumatic.  Eyes: Pupils are equal and round. No discharge.  Neck: Normal range of motion. Neck supple. No JVD present. No thyromegaly present.  Cardiovascular: Normal rate, regular rhythm, normal heart sounds. Exam reveals no gallop and no friction rub. No murmur heard.  Pulmonary/Chest: Effort normal and breath sounds are diminished. No stridor. No respiratory distress. She has no wheezes. She has no rales. She exhibits no tenderness.  Abdominal: Soft. Bowel sounds are normal. She exhibits no distension. There is no tenderness. There is no rebound and no guarding.  Musculoskeletal: Normal range of motion. She exhibits no edema and no tenderness.  Neurological: She is alert and oriented to person, place, and time. Coordination normal.  Skin: Skin is warm and dry. No rash noted. She is not diaphoretic. No erythema. No pallor.  Psychiatric: She has a normal mood and affect. Her behavior is normal. Judgment and thought content normal.     ONG:EXBMW  Rhythm  Low voltage in precordial leads.   ABNORMAL     ASSESSMENT AND PLAN

## 2013-09-28 NOTE — Assessment & Plan Note (Signed)
Blood pressure is well controlled on current medications. 

## 2013-10-02 ENCOUNTER — Other Ambulatory Visit: Payer: Self-pay

## 2013-10-02 ENCOUNTER — Other Ambulatory Visit (INDEPENDENT_AMBULATORY_CARE_PROVIDER_SITE_OTHER): Payer: Medicare Other

## 2013-10-02 DIAGNOSIS — R0602 Shortness of breath: Secondary | ICD-10-CM

## 2013-10-02 DIAGNOSIS — R9431 Abnormal electrocardiogram [ECG] [EKG]: Secondary | ICD-10-CM

## 2013-11-25 ENCOUNTER — Other Ambulatory Visit: Payer: Self-pay

## 2013-11-25 MED ORDER — PREDNISONE 10 MG PO TABS: 30.0000 mg | ORAL_TABLET | Freq: Every day | ORAL | Status: AC

## 2013-12-18 ENCOUNTER — Other Ambulatory Visit: Payer: Self-pay

## 2014-01-08 DEATH — deceased

## 2014-05-18 IMAGING — CT CT FOOT*R* W/CM
4 of 7 series · 12 of 33 positions shown, 13 images · IV contrast (isovue)
Comparison: Right foot radiographs performed earlier today at [DATE]
p.m.

CLINICAL DATA: Right foot pain and dorsal soft tissue swelling for
several months.

EXAM:
CT OF THE RIGHT FOOT WITH CONTRAST
TECHNIQUE: Multidetector CT imaging was performed following the standard
protocol during bolus administration of intravenous contrast.
CONTRAST:  100 mL of Isovue 300 IV contrast

[Series 3: foot · axial · 0.30mm/px · z∈[-50,+108]mm · 4 of 177 slices shown, 5 images]
[im 36/177  soft-tissue]
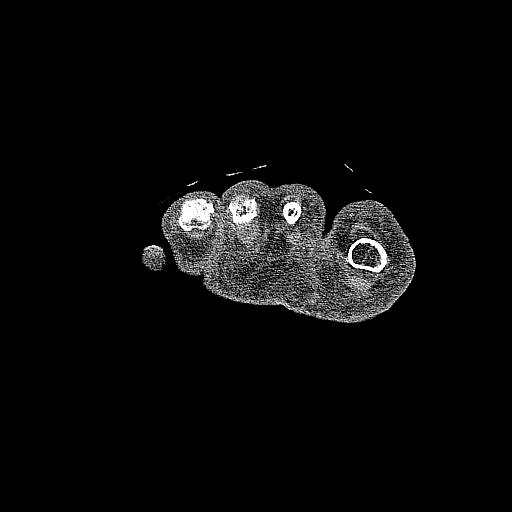
[im 36/177  bone]
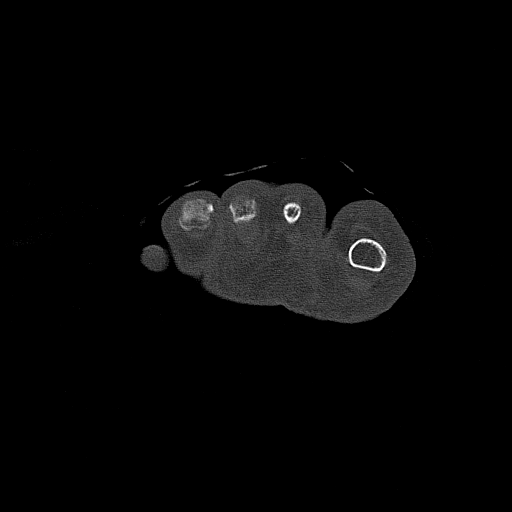
[im 71/177  bone]
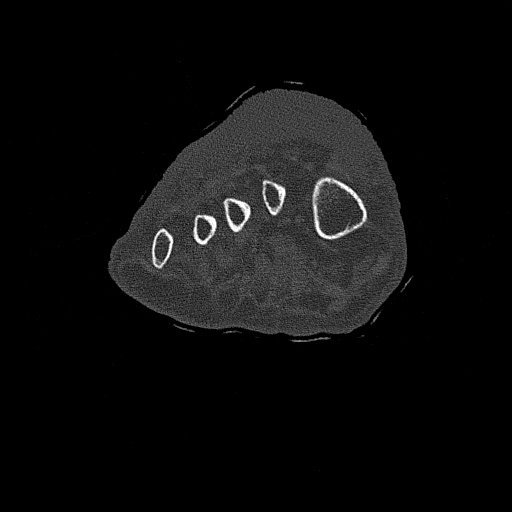
[im 106/177  bone]
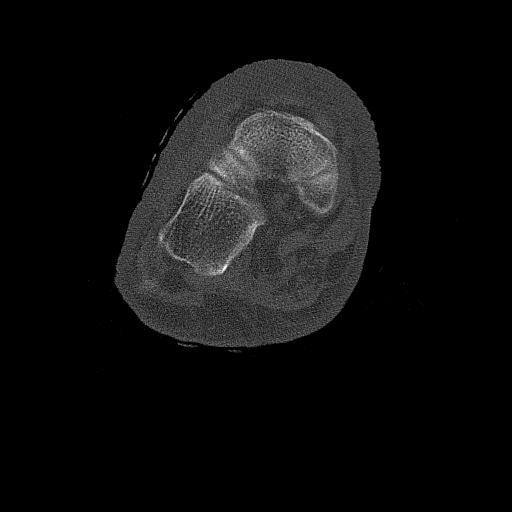
[im 141/177  bone]
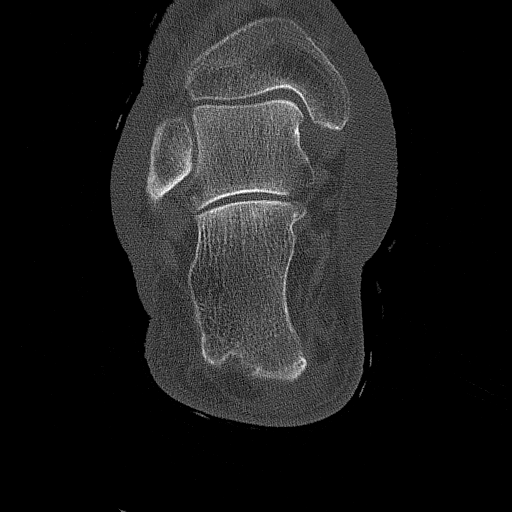

[Series 8: rt foot bone cor 2 · coronal · 0.27mm/px · 1 of 46 slices shown]
[im 23/46  bone]
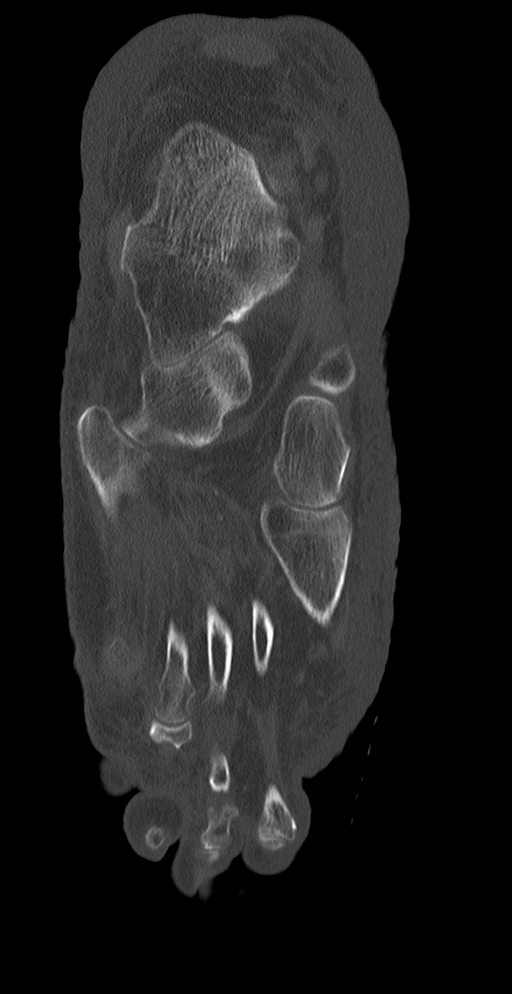

[Series 9: rt foot bone sag 2 · sagittal · 0.27mm/px · 5 of 46 slices shown]
[im 8/46  bone]
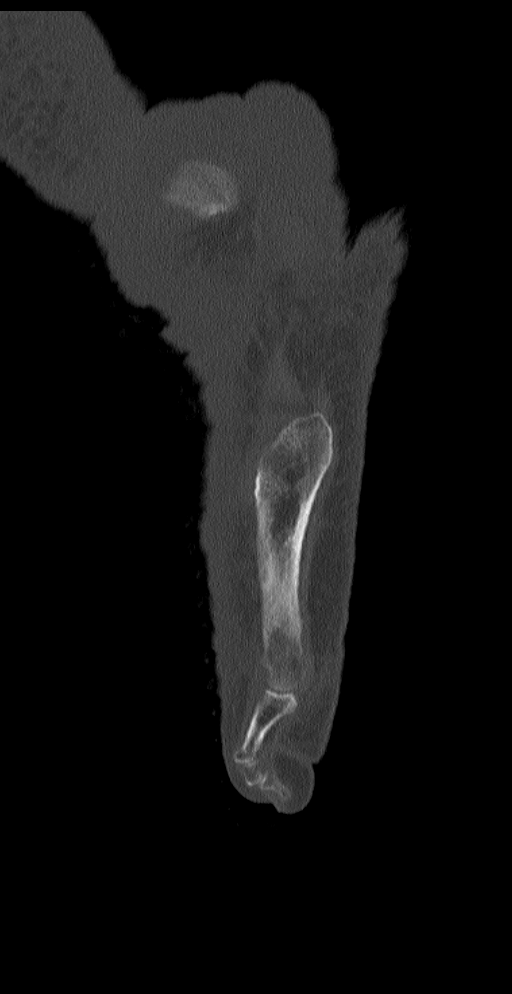
[im 16/46  bone]
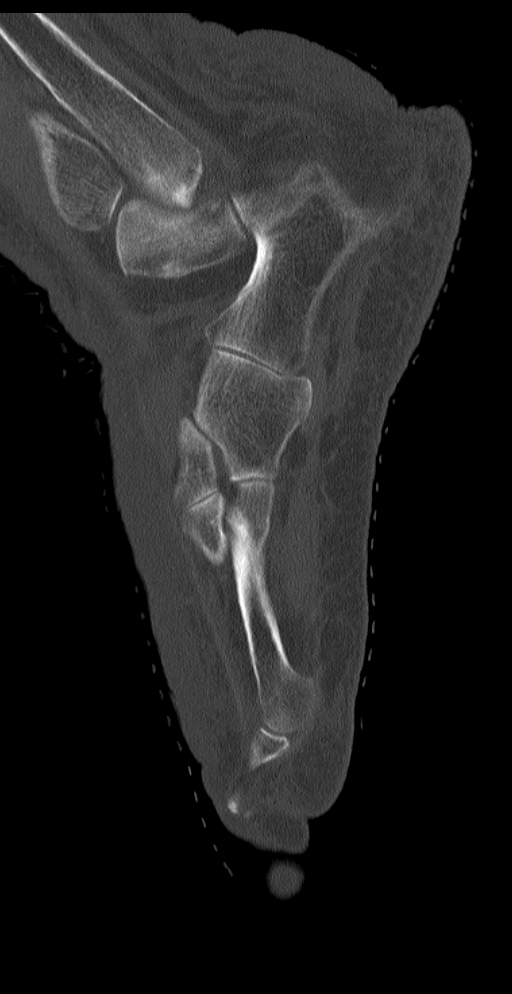
[im 23/46  bone]
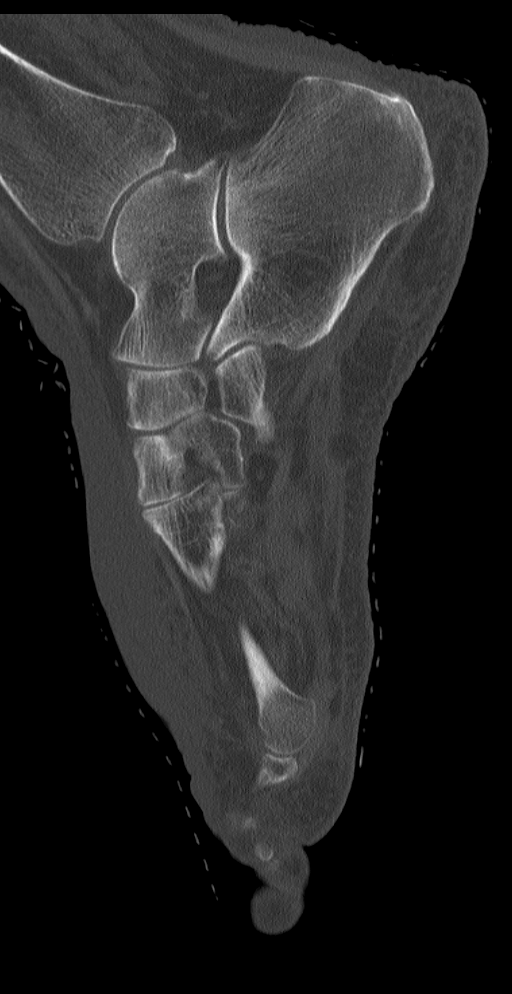
[im 31/46  bone]
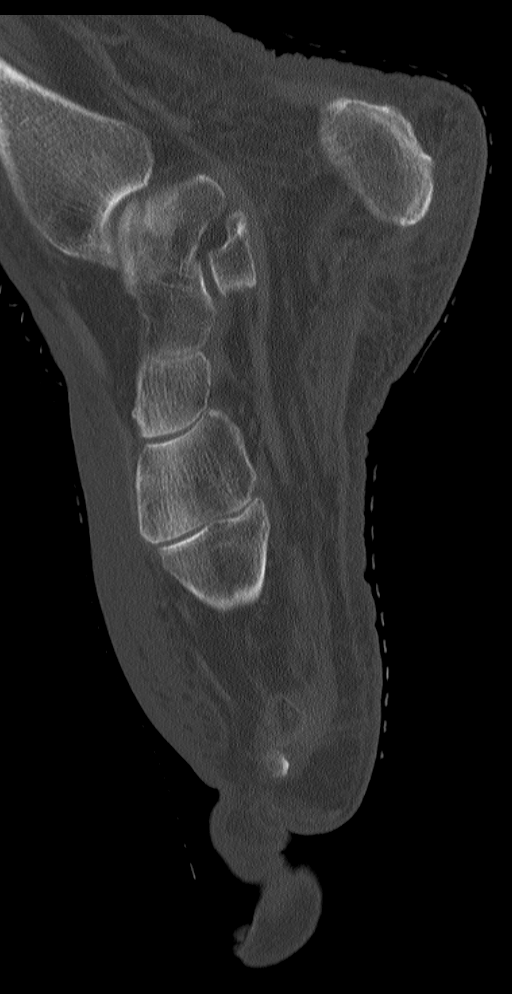
[im 38/46  bone]
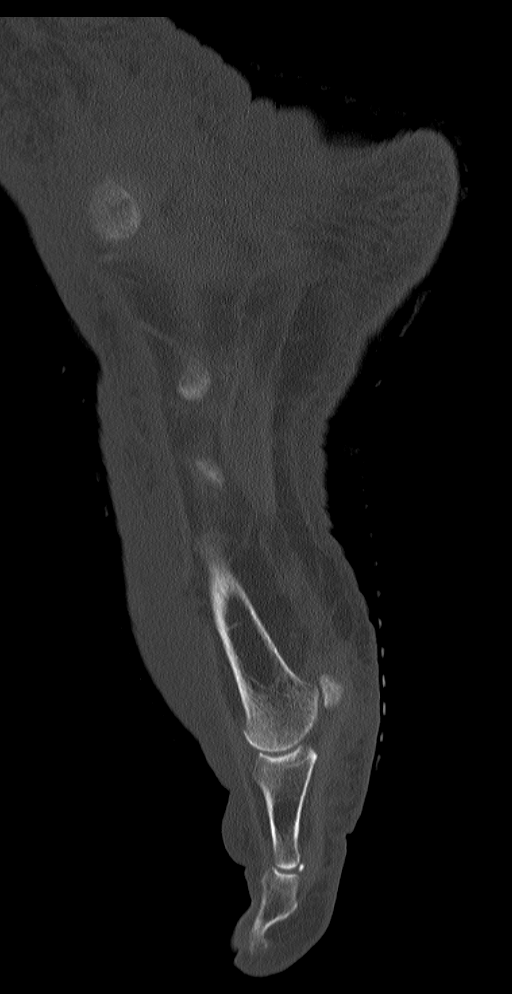

[Series 10: rt foot st axials 2 · axial · 0.25mm/px · z∈[-13,+73]mm · 2 of 131 slices shown]
[im 44/131  bone]
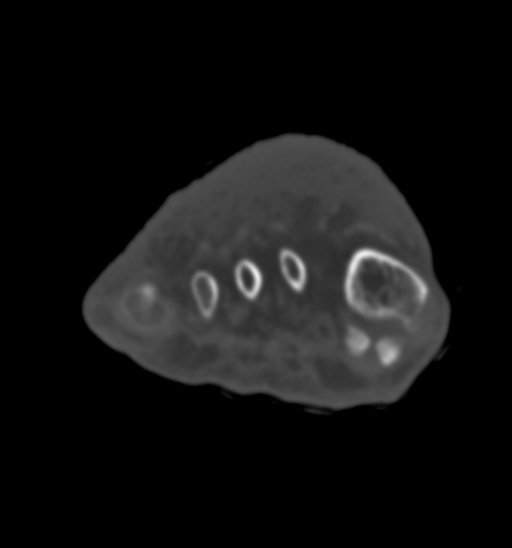
[im 87/131  bone]
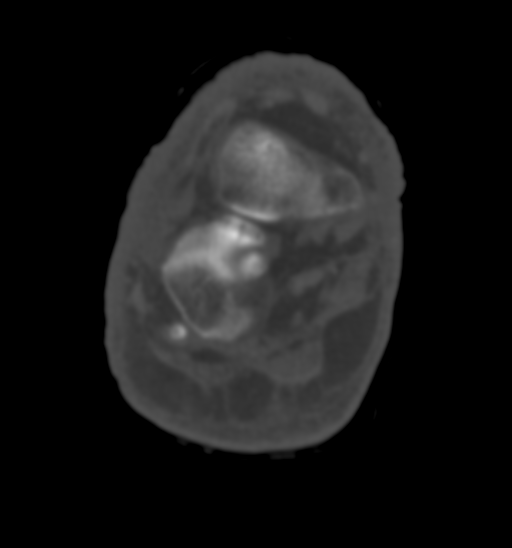

[12 of 33 positions shown; findings below may reference images not displayed]

FINDINGS: Significant dorsal soft tissue swelling is noted along the foot;
there is more mild diffuse soft tissue swelling about the remainder
of the foot and ankle. There is no evidence of abscess at this time.
Edema appears to be mostly superficial in nature. No radiopaque
foreign bodies are seen.

The underlying flexor and extensor tendons appear intact. The
peroneus tendons are grossly unremarkable in appearance. The
Achilles tendon remains intact. The vasculature is difficult fully
assess due to poor enhancement, of uncertain significance. No
definite ankle joint effusion is seen. The sinus tarsi is grossly
unremarkable in appearance.

There is no evidence of fracture or dislocation. An os peroneum is
noted. Visualized joint spaces are preserved. No significant
degenerative change is seen, though there is mild chronic plantar
subluxation at the proximal interphalangeal joints. The subtalar
joint is grossly unremarkable in appearance. The ankle mortise
appears intact. The interosseous space is preserved.
IMPRESSION: 1. No evidence of abscess. Significant dorsal soft tissue swelling
along the foot, with more mild diffuse soft tissue swelling about
the remainder of the foot and ankle. The edema is relatively
superficial in nature. No radiopaque foreign bodies seen.
2. Vasculature not well assessed due to poor enhancement, of
uncertain significance.
3. No evidence of fracture or dislocation.
4. Os peroneum noted.

## 2014-05-18 IMAGING — CR RIGHT ANKLE - COMPLETE 3+ VIEW
1 series · 3 of 3 positions shown · non-contrast
Comparison: Foot radiographs 07/16/2005.

CLINICAL DATA: Diabetic with toe pain, swelling and erythema.

EXAM:
RIGHT ANKLE - COMPLETE 3+ VIEW

[Series 1: ap · 0.17mm/px · 3 of 3 slices shown]
[im 1/3]
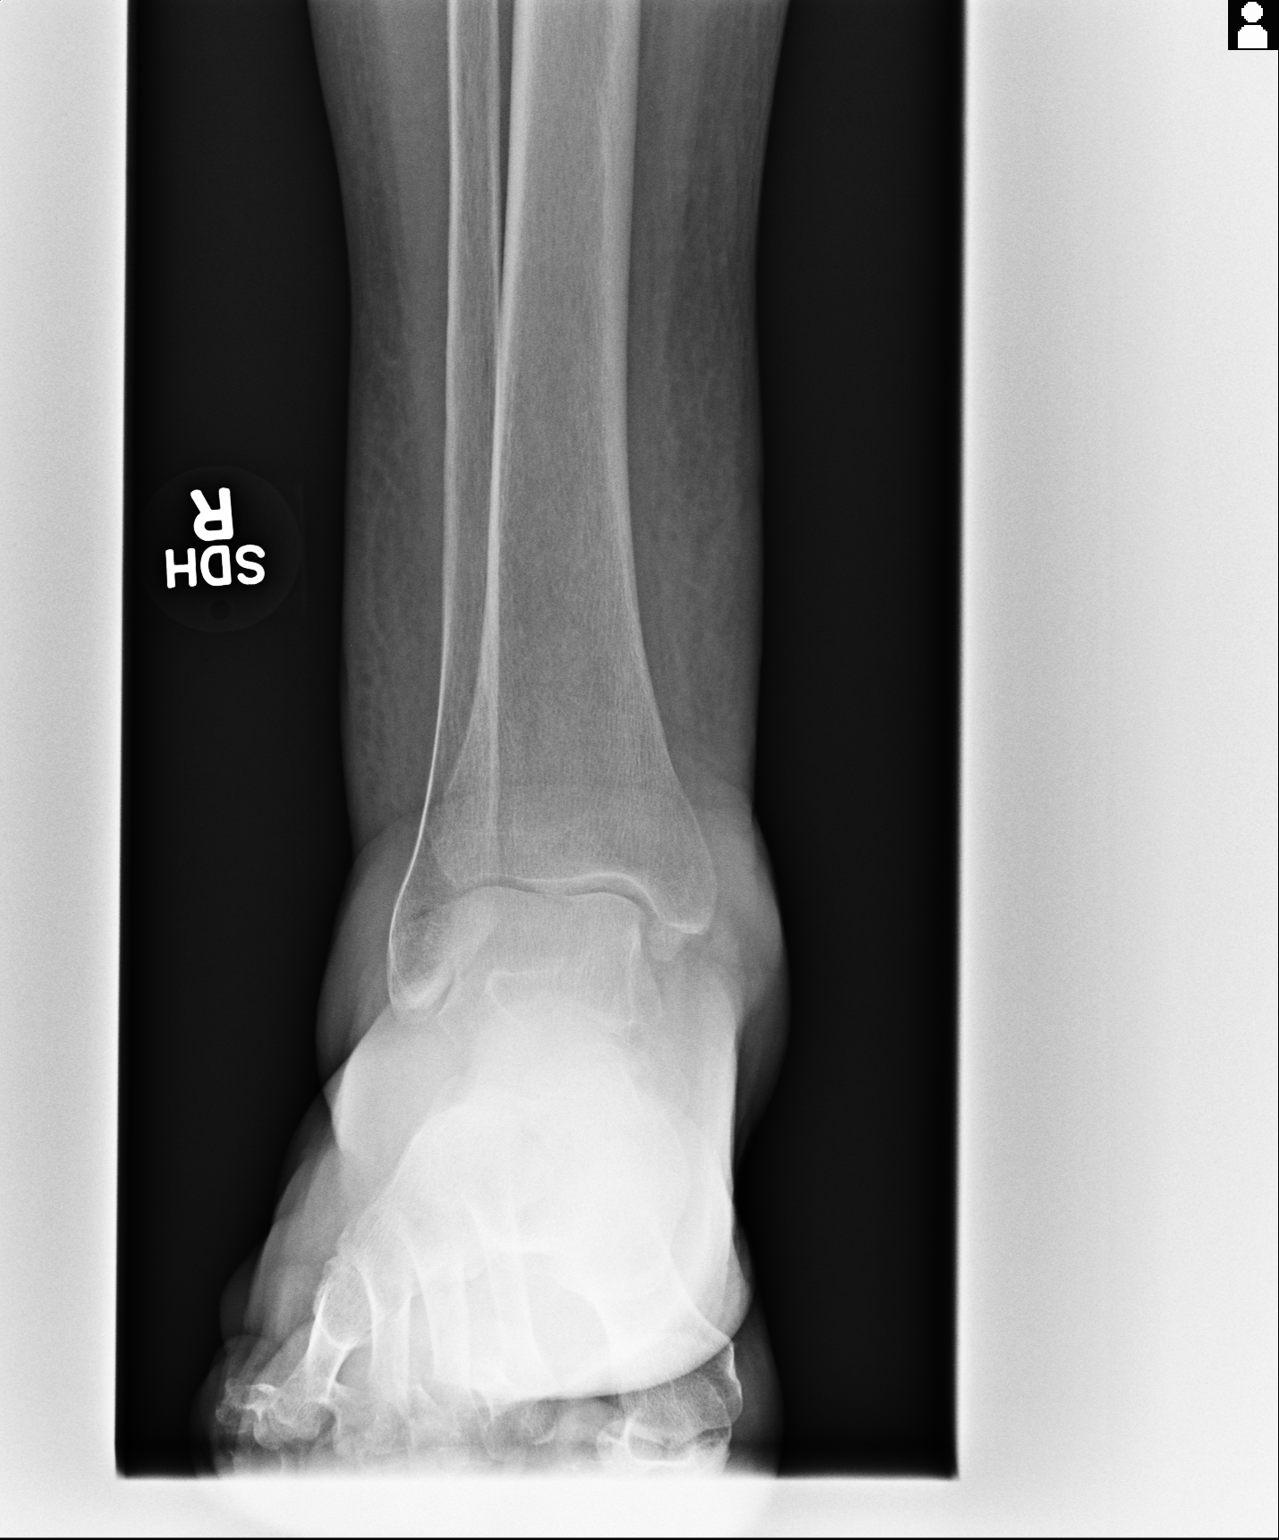
[im 2/3]
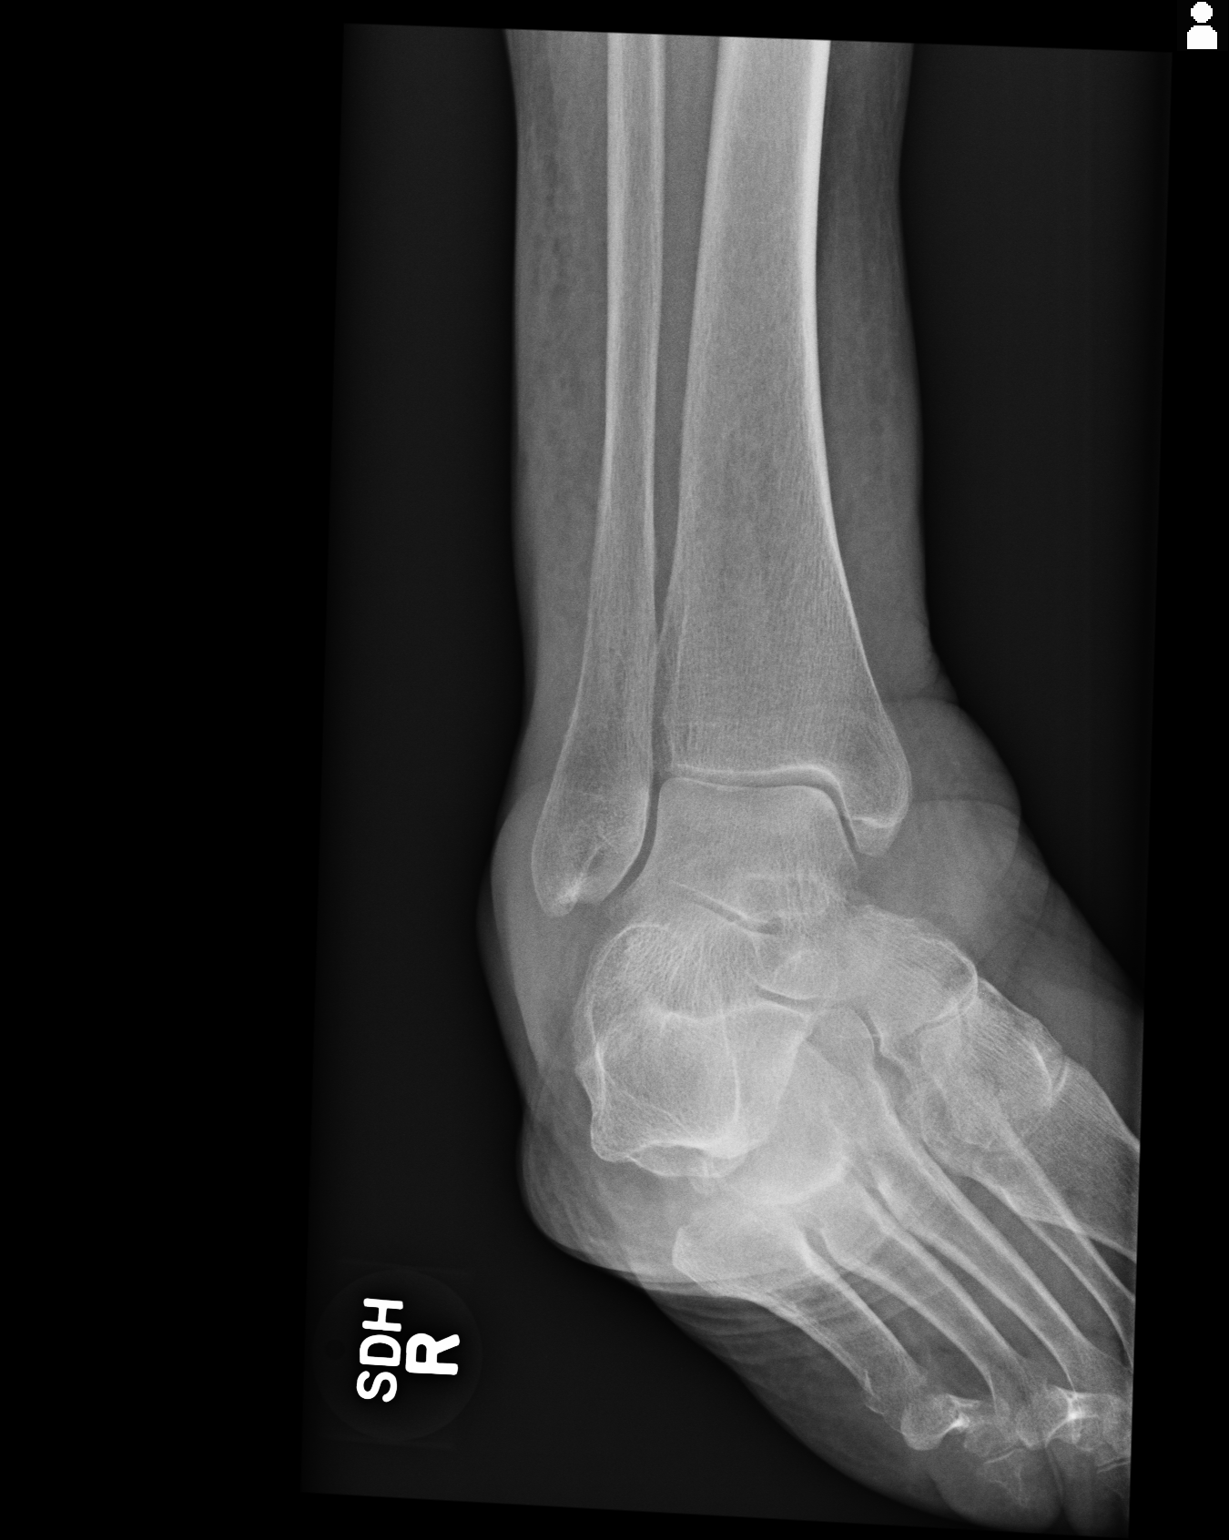
[im 3/3]
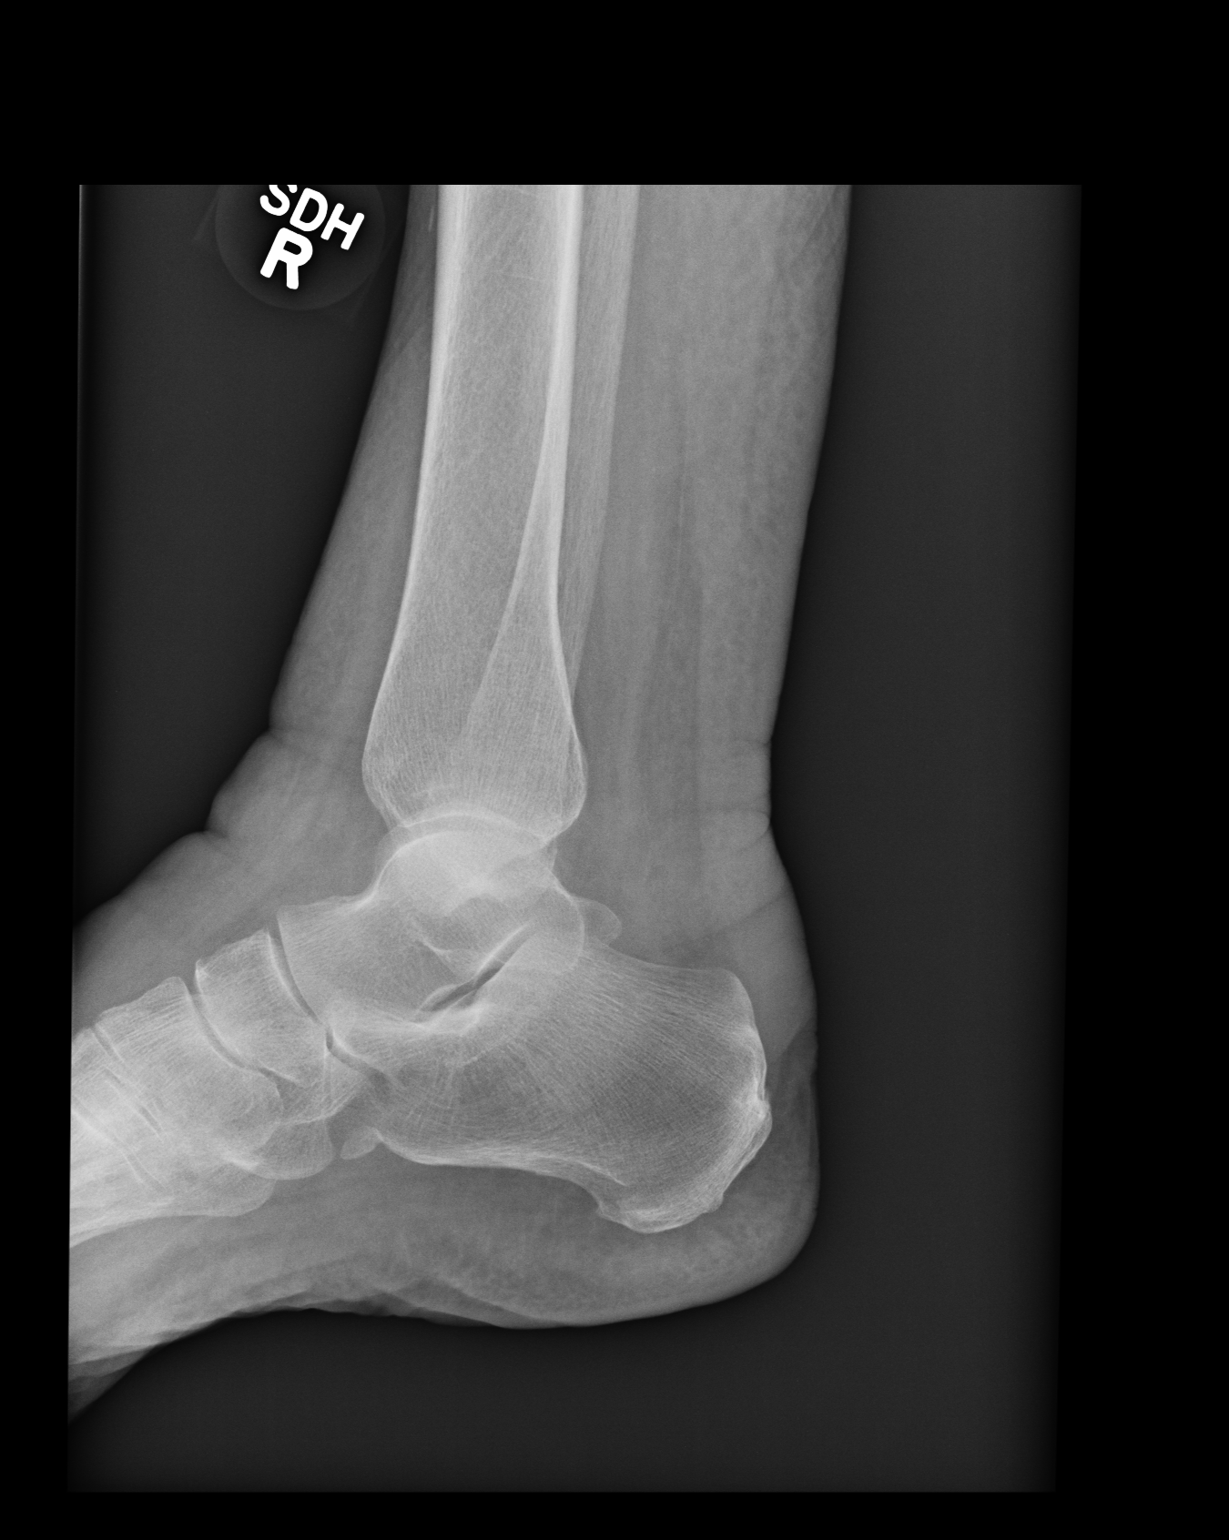

[3 of 3 positions shown; findings below may reference images not displayed]

FINDINGS: The bones appear mildly demineralized. There is no evidence of acute
fracture, dislocation, bone destruction or foreign body. There is
soft tissue swelling throughout lower leg and dorsum of the foot. No
soft tissue emphysema is identified.
IMPRESSION: Soft tissue swelling suspicious for cellulitis. No evidence of
foreign body or osteomyelitis.
